# Patient Record
Sex: Female | Born: 1970 | Race: Black or African American | Hispanic: No | Smoking: Never smoker
Health system: Southern US, Community
[De-identification: ages and names within clinical notes are randomized; demographics above are authoritative.]

## PROBLEM LIST (undated history)

## (undated) ENCOUNTER — Emergency Department (HOSPITAL_COMMUNITY): Admission: EM | Payer: Medicare Other

## (undated) DIAGNOSIS — F329 Major depressive disorder, single episode, unspecified: Secondary | ICD-10-CM

## (undated) DIAGNOSIS — E7800 Pure hypercholesterolemia, unspecified: Secondary | ICD-10-CM

## (undated) DIAGNOSIS — F419 Anxiety disorder, unspecified: Secondary | ICD-10-CM

## (undated) DIAGNOSIS — R519 Headache, unspecified: Secondary | ICD-10-CM

## (undated) DIAGNOSIS — F32A Depression, unspecified: Secondary | ICD-10-CM

## (undated) DIAGNOSIS — G709 Myoneural disorder, unspecified: Secondary | ICD-10-CM

## (undated) DIAGNOSIS — R51 Headache: Secondary | ICD-10-CM

## (undated) DIAGNOSIS — R5383 Other fatigue: Secondary | ICD-10-CM

## (undated) DIAGNOSIS — G35 Multiple sclerosis: Secondary | ICD-10-CM

## (undated) DIAGNOSIS — IMO0001 Reserved for inherently not codable concepts without codable children: Secondary | ICD-10-CM

## (undated) DIAGNOSIS — G35D Multiple sclerosis, unspecified: Secondary | ICD-10-CM

## (undated) DIAGNOSIS — R079 Chest pain, unspecified: Secondary | ICD-10-CM

## (undated) HISTORY — PX: CHOLECYSTECTOMY: SHX55

## (undated) HISTORY — PX: ABDOMINAL HYSTERECTOMY: SHX81

## (undated) HISTORY — PX: WISDOM TOOTH EXTRACTION: SHX21

---

## 2003-06-03 ENCOUNTER — Emergency Department (HOSPITAL_COMMUNITY): Admission: EM | Admit: 2003-06-03 | Discharge: 2003-06-03 | Payer: Self-pay | Admitting: Emergency Medicine

## 2004-02-27 ENCOUNTER — Observation Stay (HOSPITAL_COMMUNITY): Admission: EM | Admit: 2004-02-27 | Discharge: 2004-02-28 | Payer: Self-pay | Admitting: Emergency Medicine

## 2004-02-28 ENCOUNTER — Encounter (INDEPENDENT_AMBULATORY_CARE_PROVIDER_SITE_OTHER): Payer: Self-pay | Admitting: *Deleted

## 2004-07-28 ENCOUNTER — Other Ambulatory Visit: Admission: RE | Admit: 2004-07-28 | Discharge: 2004-07-28 | Payer: Self-pay | Admitting: Family Medicine

## 2006-01-15 ENCOUNTER — Other Ambulatory Visit: Admission: RE | Admit: 2006-01-15 | Discharge: 2006-01-15 | Payer: Self-pay | Admitting: Family Medicine

## 2007-02-18 ENCOUNTER — Other Ambulatory Visit: Admission: RE | Admit: 2007-02-18 | Discharge: 2007-02-18 | Payer: Self-pay | Admitting: Family Medicine

## 2007-05-16 ENCOUNTER — Emergency Department (HOSPITAL_COMMUNITY): Admission: EM | Admit: 2007-05-16 | Discharge: 2007-05-17 | Payer: Self-pay | Admitting: Emergency Medicine

## 2007-09-30 ENCOUNTER — Encounter: Admission: RE | Admit: 2007-09-30 | Discharge: 2007-09-30 | Payer: Self-pay | Admitting: Family Medicine

## 2008-01-31 ENCOUNTER — Inpatient Hospital Stay (HOSPITAL_COMMUNITY): Admission: EM | Admit: 2008-01-31 | Discharge: 2008-02-01 | Payer: Self-pay | Admitting: Emergency Medicine

## 2008-02-03 ENCOUNTER — Encounter: Admission: RE | Admit: 2008-02-03 | Discharge: 2008-05-03 | Payer: Self-pay | Admitting: Internal Medicine

## 2008-05-04 ENCOUNTER — Other Ambulatory Visit: Admission: RE | Admit: 2008-05-04 | Discharge: 2008-05-04 | Payer: Self-pay | Admitting: Family Medicine

## 2009-03-23 ENCOUNTER — Emergency Department (HOSPITAL_COMMUNITY): Admission: EM | Admit: 2009-03-23 | Discharge: 2009-03-23 | Payer: Self-pay | Admitting: Emergency Medicine

## 2009-08-03 ENCOUNTER — Other Ambulatory Visit: Admission: RE | Admit: 2009-08-03 | Discharge: 2009-08-03 | Payer: Self-pay | Admitting: Family Medicine

## 2009-08-24 ENCOUNTER — Encounter: Admission: RE | Admit: 2009-08-24 | Discharge: 2009-08-24 | Payer: Self-pay | Admitting: Psychiatry

## 2010-06-25 ENCOUNTER — Encounter: Payer: Self-pay | Admitting: Neurology

## 2010-10-17 NOTE — Consult Note (Signed)
NAMEJACQUELEEN, [name] NO.:  [id number]   MEDICAL RECORD NO.:  [id number]          PATIENT TYPE:  INP   LOCATION:  3036                         FACILITY:  [facility]   PHYSICIAN:  Levert Feinstein, MD          DATE OF BIRTH:  Oct 14, 1970   DATE OF CONSULTATION:  DATE OF DISCHARGE:                                 CONSULTATION   CHIEF COMPLAINT:  This is a consult from [facility] team, Dr. Lendell Caprice, for  40 year-old female with acute vertigo and abnormal MRI.   HISTORY OF PRESENT ILLNESS:  The patient is a 40 year old right-handed  African American female with past medical history of hypertension for 7  years, well controlled by low-dose hydrochlorothiazide, presenting with  acute onset of vertigo starting Tuesday 4 days prior to admission.  She  woke up and her room was spinning, she had nausea, and gait instability,  lasted about couple hours, and gradually improved.  She stayed home  Tuesday, Wednesday, and Thursday, because her mother was visiting her.  She developed mild headache on Wednesday, but was able to drove herself  for pedicure, and headache improved after a couple hours, and overall  improvement in the following 2-3 days.   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  She become very frustrated, when she tried to get to the car  to get her high blood pressure medications, she noted unsteady gait.   She also complains mouth inside felt numb.  After work on Friday, she  presented to the Urgent Care, later guided into emergency room with  admission.   This is not her first vertigo episode, initial was in January 2009, she  presenting intense vertigo with associated nausea and unsteady gait that  lasted 1 day, left with some trail of discomfort, recurrent, short  lasting symptoms with sudden positional change.  In addition, she  reported a history of leg cramping in January  2009 involving the right  anterior leg.   There was no history of sudden vision loss, and tight band sensation  along the waist, urinary bowel incontinence, genital area numbness, gait  difficulty.   The MRI of the brain without contrast has demonstrated multiple  periventricular white matter disease, and marked confluent area  surrounding the atrium of the left lateral ventricle, there was also  area involving the left superior colliculus, and lesion in the left  corona radiata as well.  Some of the lesion such as the left mid brain,  and the left occipital horn of periventricular area lesion was DWI  positive.  No contrast was giving.   PAST MEDICAL HISTORY:  Hypertension.   PAST SURGICAL HISTORY:  Cholecystectomy.   FAMILY HISTORY:  Mother suffered breast cancer.  Father had hypertension  and hyperlipidemia.   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  Denies smoke and drink.  Is married.   CURRENT MEDICATIONS:  Aspirin,  potassium supplement, and Tylenol p.r.n.   ALLERGIES:  No known drug allergies.   PHYSICAL EXAMINATION:  VITAL SIGNS:  Heart rate was 70, temperature  97.6, and blood pressure 125/82.  CARDIAC:  Regular rate and rhythm.  PULMONARY:  Clear to auscultation bilaterally.  NECK:  Supple.  No carotid bruits.  NEUROLOGIC:  She was alert, oriented, tired looking, tearful during the  interview.  Cranial nerves II-XII.  Pupils equal, round, reactive to  light.  Fundi were sharp bilaterally.  I did not see afferent pupillary  defect, the extraocular movements were full but smooth pursuit was broke  into small catch-up saccade, worsening gaze to the left.  There was  horizontal nystagmus, not obvious when gaze to the right.  Facial  sensation had subjective decreased to light touch on the right face.  Corneal reflex normal and symmetric.  Gag reflex was brisk and uvula and  tongue midline.  Head turning and shoulder  shrugging were normal and  symmetric.  Tongue protrusion and cheek strength was normal.  Motor  examination, normal tone, bulk, and strength.  Sensory, decreased light  touch on the right hand and face.  Deep tendon reflex diffusely,  hyperreflexia 3/4.  Plantar responses were flexor bilaterally.  Coordination, mild dysmetria on left finger-to-nose, heel-to-shin.  Gait, she walked with a wide base, mildly unsteady gait, was not able to  perform tandem walking.   LAB EVALUATION:  ESR was 9.  Lipid profile demonstrated elevated  cholesterol of 207, LDL of 149.  Drug screen was negative.  Urine had a  mild urinary tract infection.  Pregnancy test was negative.  Alcohol  level was negative and CMP has demonstrated mild low potassium, was  otherwise normal.   ASSESSMENT:  A 40 year old female presenting with recurrent vertigo,  with mild right facial and mouth numbness and ataxic gait.  MRI  demonstrates multiple lesions involving left mid brain, and bilateral  periventricular white matter.   The clinical presentation in her age group, and MRI findings is most  supportive of relapsing remitting multiple sclerosis.   Plan is as following:  1. Complete imaging study, MRI of the brain with contrast, MRI of the      cervical and thoracic with and without contrast.  2. Lab evaluation to rule out mimics , such as vasculitis, Lyme      disease, syphilis, neurosarcoidosis, HIV.  3. CSF study to look in for oligoclonal bands, and elevated IgG index.  4. The patient will need physical therapy and occupational therapy for      her unsteady gait.  5. Hold off steroid treatment now.  She has mild neurological deficit,      hope to recover gradually, and we will finish the workup. Steroids      will not change to long-term outcome.  6. Will follow up with [facility] 1 week      postdischarge for long-term care plan.      Levert Feinstein, MD  Electronically Signed     YY/MEDQ  D:   01/31/2008  T:  02/01/2008  Job:  [id number]

## 2010-10-17 NOTE — H&P (Signed)
NAMEKENNEDEY, DIGILIO NO.:  [id number]   MEDICAL RECORD NO.:  [id number]          PATIENT TYPE:  INP   LOCATION:  3036                         FACILITY:  [facility]   PHYSICIAN:  Deirdre Peer. Polite, M.D. DATE OF BIRTH:  1970-06-24   DATE OF ADMISSION:  01/30/2008  DATE OF DISCHARGE:                              HISTORY & PHYSICAL   CHIEF COMPLAINT:  The patient presents to the ED with complaint of right  face and arm numbness since 6 a.m. this morning.  The patient awoken  with above symptoms.  Denied headaches.  However, did complain of  feeling off balance.  The patient presented to the walk-in clinic.  Subsequently was sent to the ED.  The patient was seen in the ED at  8:00, had screening labs.  BMET showed mild hypokalemia.  LFTs within  normal limits.  Alcohol level negative.  UA:  Leukocyte esterase small,  rare bacteria.  Drug screen negative.  CT of the head negative.  The  patient persistently had the above neural deficits.  Therefore, medicine  was called for evaluation.  The patient states she has had problems with  feeling off balance since January 2009.  She was treated with vertigo,  never had symptoms of right face and arm numbness.   PAST MEDICAL HISTORY:  Significant for hypertension.   CURRENT MEDICATIONS:  Hydrochlorothiazide 25 mg daily.   SOCIAL HISTORY:  Negative for tobacco.  Social alcohol.  No drugs.   ALLERGIES:  None.   FAMILY HISTORY:  Significant for hypertension, breast cancer and high  cholesterol in her mother.  Father hypertension, high cholesterol.   REVIEW OF SYSTEMS:  As stated in the HPI.   PHYSICAL EXAMINATION:  The patient is somewhat lethargic, otherwise  hemodynamically stable.  Temperature 98.9, BP 132/85, pulse 67,  respiratory rate 20, saturating 99%.  HEENT EXAM:  Unremarkable.  Negative for any carotid bruit.  CHEST:  Clear without rales or rhonchi.  CARDIOVASCULAR:  Regular S1-S2.  No S3.  ABDOMEN:  Soft and  tender.  EXTREMITIES:  No edema.  NEUROLOGIC EXAM:  Cranial nerves II-XII intact.  Motor 5/5.  Deep tendon  reflexes symmetrical.  Extraocular was intact.  Patient is unable to  perform tandem gait as she feels like she is falling to the right.  Toes  are downgoing bilaterally.  She has negative Romberg.  Sensation:  The  patient complains of feeling dullness on the right side of her face.   DATA:  As stated in HPI.   ASSESSMENT:  Neurological deficit characterized by prolonged right  facial numbness, right arm-hand numbness and feeling off balance.  Recommend the patient be admitted to a medicine floor bed.  The patient  will have a MRI-  MRA of brain.  Will obtain sed rate__________ TSH, ANA.  Will make  further recommendations after review of the above studies.  The patient  may warrant a neurology evaluation.   </PAST      Deirdre Peer. Polite, M.D.  Electronically Signed     RDP/MEDQ  D:  01/31/2008  T:  01/31/2008  Job:  [id number]

## 2010-10-17 NOTE — Discharge Summary (Signed)
[name], [name]                ACCOUNT NO.:  [id number]   MEDICAL RECORD NO.:  [id number]          PATIENT TYPE:  INP   LOCATION:  3036                         FACILITY:  [facility]   PHYSICIAN:  Corinna L. Lendell Caprice, MDDATE OF BIRTH:  10/12/1970   DATE OF ADMISSION:  01/30/2008  DATE OF DISCHARGE:  02/01/2008                               DISCHARGE SUMMARY   DISCHARGE DIAGNOSES:  1. Right face and arm numbness and abnormal MRI, most likely relapsing      and remitting multiple sclerosis, but studies pending.  2. History of genital herpes.  3. Hypertension.  4. Hypokalemia.  5. Hyperlipidemia.   DISCHARGE MEDICATIONS:  Continue hydrochlorothiazide 25 mg p.o. daily.   ACTIVITY:  She is not to return to work until cleared by Dr. Terrace Arabia.  No  driving until cleared by Dr. Terrace Arabia.  Outpatient physical therapy and  occupational therapy 3 times a week, [phone].   CONDITION:  Stable.   DIET:  Low salt.   CONSULTATIONS:  Levert Feinstein, MD   PROCEDURES:  None.   LABORATORY DATA:  CBC on admission was normal.  Complete metabolic panel  on admission significant for potassium of 3.2.  At discharge, after  repletion, potassium is 3.6.  Point-of-care enzymes negative.  LDL 149,  HDL 38, cholesterol 207, and triglycerides 99.  TSH 3.084.  Urine  pregnancy negative.  Urine drug screen negative.  Blood alcohol less  than 5.  Urinalysis showed small leukocyte esterase, 7-10 white cells,  negative blood, and rare bacteria.  Multiple tests are pending including  C-reactive protein, ANA, ACE level, Lyme titer, anticardiolipin  antibody, lupus anticoagulants, C-ANCA, P-ANCA, hepatitis panel, HIV,  B12 level, folate, and RPR.  These can be checked up at her follow up  visit with Neurology.   SPECIAL STUDIES/RADIOLOGY:  CT brain without contrast was negative.  MRI  of the brain without contrast showed multiple areas of T2 and flare  signal hyperintensity in the periventricular white matter bilaterally  with marked confluent areas concerning for demyelinating process such as  multiple sclerosis, viral encephalitis, or vasculitis, also within the  differential, atypical migraine or metastatic disease considered much  less likely.  MRI of the T-spine showed no demyelinating process or  infarct.  There was a marrow lesion involving the right aspect of the C7  vertebral body, likely hemangioma.  MRI of thoracic spine showed no  evidence of demyelinating process or infarct.  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  Multiple CT lesions do not  exhibit contrast enhancement.   HISTORY AND HOSPITAL COURSE:  [name] is a pleasant 40 year old black  female with a history of hypertension who presented to the emergency  room after noticing right face and hand numbness.   Please see H&P for admission details.  Upon further questioning, she  also reported having periodic vertigo and difficulty walking since  January.  She also reported photophobia and intermittent right hip pain  that has been  unexplained.  She also had problems concentrating and  particularly had difficulty talking.  She also noticed that she had  difficulty typing the day prior to admission.  She also reported some  tingling of her face and hand.  She had normal vital signs.  On physical  examination, she had decreased sensation on the right face and right  hand.  She was unable to perform tandem gait and had a wide-based gait.  Dr. Terrace Arabia was consulted and noted that the patient had horizontal  nystagmus and difficulty with mild dysmetria on the left.  MRI was done  and showed the above results.  This is felt to be relapsing remitting  multiple sclerosis, however, multiple studies have been ordered to rule  out other causes.  Her erythrocyte sedimentation rate is back and is   only 9.  The other tests were ordered today and are pending.  Physical  therapy saw the patient and recommended outpatient physical therapy,  also recommending outpatient occupational therapy.  A lumbar puncture  was ordered under fluoroscopic guidance, but it could not be done as it  was not an emergency and today is Sunday.  Dr. Terrace Arabia cancelled the lumbar  puncture and recommends doing it as an outpatient.  She also may decide  to do a bone scan as an outpatient, but I will defer to her.  The  patient will schedule up with Dr. Terrace Arabia and I have written a note for work  as well as for physical therapy and occupational therapy as an  outpatient.   Total time on the day of discharge is 1 hour.      Corinna L. Lendell Caprice, MD  Electronically Signed     CLS/MEDQ  D:  02/01/2008  T:  02/02/2008  Job:  [id number]   cc:   Levert Feinstein, MD  Otilio Connors. Gerri Spore, M.D.

## 2010-10-17 NOTE — H&P (Signed)
NAMEAEON, KOORS NO.:  [id number]   MEDICAL RECORD NO.:  [id number]          PATIENT TYPE:  INP   LOCATION:  3036                         FACILITY:  [facility]   PHYSICIAN:  Deirdre Peer. Polite, M.D. DATE OF BIRTH:  Apr 24, 1971   DATE OF ADMISSION:  01/30/2008  DATE OF DISCHARGE:                              HISTORY & PHYSICAL   CHIEF COMPLAINT:  Neuro deficit, right-sided facial numbness, right leg  numbness, and feeling off balance.   HISTORY OF PRESENT ILLNESS:  A 40 year old female with known history of  hypertension, who was initially seen in the Urgent Care for the above  symptoms.  The patient was sent to [facility].  The patient  states she awoke about 6 o'clock in the morning with right-sided  numbness and feeling off balance.  She had a history of having some  vertiginous symptoms dating back to January 2009; however, never right-  sided numbness.  Symptoms have been going on greater than 12 hours  before presenting to the ED for evaluation.  She is alert.  She is  oriented.  She is afebrile, hemodynamically stable.  Does carry a  history of hypertension being on hydrochlorothiazide, denies family  history of CVA.  In the ED, the patient was evaluated.  The patient had  a CT of the head without acute abnormalities.  BMET significant for a  slightly low potassium of 3.2.  CBC unremarkable.  Glucose within normal  limits.  UA significant for leukocyte esterase, small and rare bacteria.  Alcohol level less than 5.  LFTs unremarkable.  Drug screen  unremarkable.  Because of the patient's persistent neuro deficits,  Medicine was called for admission.  At the time of my evaluation, the  patient is lethargic, tired, and retells events as stated above.   PAST MEDICAL HISTORY:  Significant for hypertension.   CURRENT MEDICATIONS:  Include:  1. Hydrochlorothiazide 25 mg.  2. Multivitamin.   SOCIAL HISTORY:  Negative for tobacco.  Social alcohol.  No  drugs.   ALLERGIES:  None.   FAMILY HISTORY:  Mother with hypertension, high cholesterol, and breast  cancer.  Father with hypertension and high cholesterol.   REVIEW OF SYSTEMS:  Negative for any fever, chills, nausea, or vomiting.  No diarrhea, no constipation, no blood in the stool, no blood in the  urine, no chest pain, and no shortness of breath.   PHYSICAL EXAMINATION:  GENERAL:  Alert, however, allergic.  VITAL SIGNS:  Temperature 98.9, blood pressure 132/85, pulse 67,  respiratory rate 20, and sating 99%.  HEENT:  Unremarkable.  CHEST:  Clear without rales or rhonchi.  CARDIOVASCULAR:  Regular S1 and S2.  No S3.  ABDOMEN:  Soft and nontender.  EXTREMITIES:  No edema.  NEUROLOGIC:  Cranial nerves II through XII intact.  Motor 5/5.  Deep  tendon reflexes brisk.  Tandem gait, unable to perform.  The patient  constantly states that she feels she is falling to the right.  Downgoing  toes bilateral.  Negative Romberg.  SENSATION:  The patient complains of feeling decreased sensation in the  right face.   LABORATORY DATA:  As stated in the HPI.   ASSESSMENT:  Neuro deficit characterized by right face numbness, right  hand numbness, and feeling off balance.   RECOMMENDATION:  The patient be admitted.  We will obtain MRI and MRA of  the brain.  We will obtain screening labs.  We will make further  recommendations after review of those studies.  Please note, the patient  may warrant a neurology consultation.      Deirdre Peer. Polite, M.D.  Electronically Signed     RDP/MEDQ  D:  01/31/2008  T:  02/01/2008  Job:  [id number]

## 2010-10-20 NOTE — H&P (Signed)
[name], [name] NO.:  [id number]   MEDICAL RECORD NO.:  [id number]          PATIENT TYPE:  OBV   LOCATION:  0450                         FACILITY:  [facility]   PHYSICIAN:  Anselm Pancoast. Weatherly, M.D.DATE OF BIRTH:  1971/04/14   DATE OF ADMISSION:  02/27/2004  DATE OF DISCHARGE:                                HISTORY & PHYSICAL   CHIEF COMPLAINT:  Epigastric pain.   HISTORY:  [name] is a 40 year old black female who presented to the  emergency room approximately 4:30 with severe epigastric pain that had  lasted approximately an hour.  The patient had a similar episode of pain and  this is epigastric going to the small of the back approximately 10 days ago  and saw her regular physician Dr. Shaune Pollack, the following day liver  function studies were reported to be normal and then an ultrasound was  obtained at __________ that showed numerous stones within her gallbladder.  She had called and had an appointment with Dr. Derrell Lolling next Thursday but with  this severe episode of epigastric pain now she presented to the emergency  room.  She was seen by Dr. Wallace Cullens who did not have a copy or could not get the  results of the ultrasound and he repeated it plus did liver functions  studies.  The liver function studies now the SGOT and PT were abnormal and  repeat ultrasound was done and I was called approximately midnight.  I  talked with the patient and arranged to admit her, told her we would repeat  her liver function studies and then decide in the morning whether she needed  just to proceed with a laparoscopic cholecystectomy with cholangiogram or  whether possibly do an ERCP preoperatively if her liver tests were  definitely abnormal and worsening.   PAST MEDICAL HISTORY:  The patient has a history of mild hypertension and is  on hydrochlorothiazide.  She has got two children and has been on birth  control previously but not on birth control pills at this time.   CHRONIC MEDICATIONS:  Hydrochlorothiazide.  She is also on Zoloft  intermittently for chronic anxiety.   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   ALLERGIES:  No known allergies.   PHYSICAL EXAMINATION:  GENERAL:  She is a pleasant slightly overweight  female who appears anxious but not really complaining of any acute pain at  this time.  VITAL SIGNS:  Her temperature has always been normal 98.1.  Blood pressure  is 123/68, pulse 61, respirations 16.  When she first arrived, her pulse was  approximately 100.  HEAD AND NECK:  Eyes, ears, nose and throat well hydrated, no acute  problems, no cervical or supraclavicular lymphadenopathy.  LUNGS:  Clear.  CARDIAC:  Normal sinus rhythm.  BREASTS:  Negative.  ABDOMEN:  She is not acutely tender in the abdomen, maybe with deep  palpitation may be slightly tender.  RECTAL/PELVIC:  I did not do a rectal or pelvic exam on her.  EXTREMITIES:  Unremarkable.  CNS:  Physiologic.  LABORATORIES:  SGOT and SGPT are mildly elevated at approximately 600 and  400, respectively.  The total bilirubin and alkaline phosphatase are normal,  her white count is normal and her hematocrit is normal.   ASSESSMENT AND PLAN:  We will place her on antibiotics, n.p.o. and IV  fluids, repeat the liver function studies and then decide whether to proceed  with an urgent cholecystectomy in the early morning or whether she possibly  will need an ERCP.  Dr. Claudette Head is on call and we will contact him if  needed.      WJW/MEDQ  D:  02/27/2004  T:  02/27/2004  Job:  [id number]

## 2010-10-20 NOTE — Op Note (Signed)
NAMECHAMYA, HUNTON NO.:  [id number]   MEDICAL RECORD NO.:  [id number]          PATIENT TYPE:  OBV   LOCATION:  0450                         FACILITY:  [facility]   PHYSICIAN:  Anselm Pancoast. Weatherly, M.D.DATE OF BIRTH:  1971-01-18   DATE OF PROCEDURE:  02/27/2004  DATE OF DISCHARGE:                                 OPERATIVE REPORT   PREOPERATIVE DIAGNOSIS:  Chronic cholelithiasis with probable passage of  common duct stone.   POSTOPERATIVE DIAGNOSIS:  Chronic cholelithiasis with probable passage of  common duct stone.   OPERATION PERFORMED:  Laparoscopic cholecystectomy with cholangiogram.   SURGEON:  Anselm Pancoast. Zachery Dakins, M.D.   ANESTHESIA:  General.   INDICATIONS FOR PROCEDURE:  Asa Fath is a 40 year old female who  presented to the emergency room on Saturday afternoon after having the onset  of severe epigastric pain approximately 4 o'clock p.m.  The patient  approximately 10 days ago had an episode of similar pain radiating to the  epigastric area and small of her back and saw her regular physician, who  ordered an ultrasound of the gallbladder that was done at [facility].  This supposedly showed gallstones and she had called and  arranged for an appointment with Dr. Derrell Lolling which was scheduled for next  Thursday; however, with the onset of pain on Saturday, she presented to the  emergency room and was sent by Dr. Wallace Cullens.  He could not get the results of  the ultrasound and repeated it.  He also did liver function studies and this  time, the liver function studies that had been reported to be normal  previously were mildly elevated with an SGOT of 681, SGPT of 372.  The  ultrasound was repeated and I was called about midnight when it was  confirmed that she did have gallstones.  I talked with the patient and  recommended that we keep her and repeat the liver function studies.  If they  were abnormal and she was still having pain, we possibly  would need to do an  ERCP.  If her pain resolved and the tests were not obviously worsened, we  could proceed with a laparoscopic cholecystectomy and do an ERCP if the  stone was demonstrated in the common bile duct.  The following morning,  basically, the patient was pain free.  Her liver tests that were done about  three hours after the first set were more abnormal but it still just OT, PT,  alkaline phosphatase and bilirubin were sort of within normal limits.  Since  she was pain free and I talked with Judie Petit T. Russella Dar, M.D., we recommended  that we proceed on with a laparoscopic cholecystectomy.  Dr. Russella Dar would be  available if an ERCP was needed.  The patient had been started on Zosyn and  we gave her a dose immediately prior to surgery.   DESCRIPTION OF PROCEDURE:  Induction of general anesthesia.  She had PAS  stockings, positioned on the operating table, induction of general  anesthesia, endotracheal tube, oral tube to the stomach.  The abdomen was  prepped with  Betadine solution and draped in sterile manner.  A small  incision was made below the umbilicus.  She weighs about 180 pounds.  The  fascia was identified.  This was picked up between two Kochers and a small  opening made and entered into the peritoneal cavity.  A pursestring suture  of 0 Vicryl was placed and Hasson cannula introduced.  The gallbladder was  acutely inflamed and the upper 10 mm trocar was placed in the subxiphoid  after anesthetizing the fascia and Dr. Carolynne Edouard placed the two lateral 5 mm  trocars after anesthetizing the fascia.  The gallbladder was retracted  upward in abdomen, the proximal portion was placed on tension.  There were  some adhesions from the omentum to the gallbladder and these were kind of  carefully taken down.  The cystic duct was identified and this was  encompassed with a right angle and then a clip placed flush with the distal  portion of the cystic duct with the junction of the  gallbladder.  A small  opening was made and there was bile that was not under significant pressure  that came forth.  The [facility] catheter was introduced and held in place with a  clip and an X-ray was obtained.  It appears that the cystic duct actually  may come off the right hepatic or the three may come together in a pretty  similar area but there was good flow into the distal common bile duct and to  the duodenum, nothing dilated, no evidence of any common duct stones.  The  catheter was removed and then the cystic duct was triply clipped and divided  distally.  We were up close to the gallbladder and the cystic artery was  identified, triply clipped and divided proximal to the first two clips and  then the gallbladder was freed from its bed using predominantly the hook  electrocautery.  I placed the gallbladder in EndoCatch bag since it was so  acutely inflamed and numerous amount of stones and the looking down there  was a little aea that was bleeding and it was really coming from where the  cystic duct had been divided and another clip was placed just right at the  end with the bleeding under good control.  Next, the area was irrigated.  This was aspirated.  We were satisfied no bleeding.  Then the camera was  switched to the upper 10 mm port and gallbladder containing just numerous  stones, brought out through the fascia within the bag.  The separate figure-  of-eight suture of 0 Vicryl was placed as well as the pursestring that had  been previously placed tied and I anesthetized the fascia.  The carbon  dioxide was released.  Trocar was removed under direct vision and then the  subcutaneous wounds closed with 4-0 Vicryl.  Benzoin and Steri-Strips on the  skin.  The patient tolerated the procedure nicely and was taken to the  recovery room in stable postoperative condition.  I think the patient will  be ready for discharge in the early morning and will repeat a set of liver function  studies in approximately a week.      WJW/MEDQ  D:  02/27/2004  T:  02/28/2004  Job:  [id number]   cc:   Duncan Dull, M.D.  [address]  [place]  [place] [address]  Fax: [phone]

## 2011-02-20 ENCOUNTER — Other Ambulatory Visit: Payer: Self-pay | Admitting: Psychiatry

## 2011-02-20 DIAGNOSIS — G35 Multiple sclerosis: Secondary | ICD-10-CM

## 2011-02-23 ENCOUNTER — Ambulatory Visit
Admission: RE | Admit: 2011-02-23 | Discharge: 2011-02-23 | Disposition: A | Discharge status: Home / Self Care | Payer: 59 | Source: Ambulatory Visit | Attending: Psychiatry | Admitting: Psychiatry

## 2011-02-23 DIAGNOSIS — G35 Multiple sclerosis: Secondary | ICD-10-CM

## 2011-02-23 MED ORDER — GADOBENATE DIMEGLUMINE 529 MG/ML IV SOLN
19.0000 mL | Freq: Once | INTRAVENOUS | Status: AC | PRN
  Administered 2011-02-23: 19 mL via INTRAVENOUS

## 2011-07-06 ENCOUNTER — Other Ambulatory Visit (HOSPITAL_COMMUNITY)
Admission: RE | Admit: 2011-07-06 | Discharge: 2011-07-06 | Disposition: A | Discharge status: Home / Self Care | Payer: 59 | Source: Ambulatory Visit | Attending: Family Medicine | Admitting: Family Medicine

## 2011-07-06 ENCOUNTER — Other Ambulatory Visit: Payer: Self-pay | Admitting: Family Medicine

## 2011-07-06 DIAGNOSIS — Z124 Encounter for screening for malignant neoplasm of cervix: Secondary | ICD-10-CM | POA: Insufficient documentation

## 2011-12-20 DIAGNOSIS — G35 Multiple sclerosis: Secondary | ICD-10-CM | POA: Diagnosis not present

## 2012-01-01 ENCOUNTER — Other Ambulatory Visit: Payer: Self-pay | Admitting: Psychiatry

## 2012-01-01 DIAGNOSIS — G35 Multiple sclerosis: Secondary | ICD-10-CM

## 2012-01-09 ENCOUNTER — Inpatient Hospital Stay: Admission: RE | Admit: 2012-01-09 | Payer: 59 | Source: Ambulatory Visit

## 2012-01-15 ENCOUNTER — Ambulatory Visit
Admission: RE | Admit: 2012-01-15 | Discharge: 2012-01-15 | Disposition: A | Discharge status: Home / Self Care | Payer: 59 | Source: Ambulatory Visit | Attending: Psychiatry | Admitting: Psychiatry

## 2012-01-15 DIAGNOSIS — G35 Multiple sclerosis: Secondary | ICD-10-CM | POA: Diagnosis not present

## 2012-01-15 MED ORDER — GADOBENATE DIMEGLUMINE 529 MG/ML IV SOLN
19.0000 mL | Freq: Once | INTRAVENOUS | Status: AC | PRN
  Administered 2012-01-15: 19 mL via INTRAVENOUS

## 2012-04-16 ENCOUNTER — Other Ambulatory Visit (HOSPITAL_COMMUNITY): Payer: Self-pay | Admitting: Family Medicine

## 2012-04-16 DIAGNOSIS — Z1231 Encounter for screening mammogram for malignant neoplasm of breast: Secondary | ICD-10-CM

## 2012-04-18 DIAGNOSIS — G35 Multiple sclerosis: Secondary | ICD-10-CM | POA: Diagnosis not present

## 2012-04-18 DIAGNOSIS — G119 Hereditary ataxia, unspecified: Secondary | ICD-10-CM | POA: Diagnosis not present

## 2012-04-18 DIAGNOSIS — G959 Disease of spinal cord, unspecified: Secondary | ICD-10-CM | POA: Diagnosis not present

## 2012-05-07 DIAGNOSIS — E7800 Pure hypercholesterolemia, unspecified: Secondary | ICD-10-CM | POA: Diagnosis not present

## 2012-05-07 DIAGNOSIS — K5289 Other specified noninfective gastroenteritis and colitis: Secondary | ICD-10-CM | POA: Diagnosis not present

## 2012-05-07 DIAGNOSIS — I10 Essential (primary) hypertension: Secondary | ICD-10-CM | POA: Diagnosis not present

## 2012-05-08 ENCOUNTER — Ambulatory Visit (HOSPITAL_COMMUNITY): Payer: Medicare Other

## 2012-05-27 ENCOUNTER — Ambulatory Visit (HOSPITAL_COMMUNITY)
Admission: RE | Admit: 2012-05-27 | Discharge: 2012-05-27 | Disposition: A | Discharge status: Home / Self Care | Payer: 59 | Source: Ambulatory Visit | Attending: Family Medicine | Admitting: Family Medicine

## 2012-05-27 DIAGNOSIS — Z1231 Encounter for screening mammogram for malignant neoplasm of breast: Secondary | ICD-10-CM | POA: Diagnosis not present

## 2012-06-24 DIAGNOSIS — G35 Multiple sclerosis: Secondary | ICD-10-CM | POA: Diagnosis not present

## 2012-08-22 DIAGNOSIS — G35 Multiple sclerosis: Secondary | ICD-10-CM | POA: Diagnosis not present

## 2012-08-22 DIAGNOSIS — Z79899 Other long term (current) drug therapy: Secondary | ICD-10-CM | POA: Diagnosis not present

## 2012-09-26 DIAGNOSIS — G35 Multiple sclerosis: Secondary | ICD-10-CM | POA: Diagnosis not present

## 2012-10-31 DIAGNOSIS — G35 Multiple sclerosis: Secondary | ICD-10-CM | POA: Diagnosis not present

## 2012-11-28 DIAGNOSIS — G35 Multiple sclerosis: Secondary | ICD-10-CM | POA: Diagnosis not present

## 2012-12-26 DIAGNOSIS — G35 Multiple sclerosis: Secondary | ICD-10-CM | POA: Diagnosis not present

## 2012-12-26 DIAGNOSIS — I10 Essential (primary) hypertension: Secondary | ICD-10-CM | POA: Diagnosis not present

## 2013-01-02 DIAGNOSIS — G35 Multiple sclerosis: Secondary | ICD-10-CM | POA: Diagnosis not present

## 2013-01-30 DIAGNOSIS — G35 Multiple sclerosis: Secondary | ICD-10-CM | POA: Diagnosis not present

## 2013-02-20 DIAGNOSIS — Z23 Encounter for immunization: Secondary | ICD-10-CM | POA: Diagnosis not present

## 2013-02-20 DIAGNOSIS — I10 Essential (primary) hypertension: Secondary | ICD-10-CM | POA: Diagnosis not present

## 2013-02-20 DIAGNOSIS — G35 Multiple sclerosis: Secondary | ICD-10-CM | POA: Diagnosis not present

## 2013-03-04 DIAGNOSIS — G119 Hereditary ataxia, unspecified: Secondary | ICD-10-CM | POA: Diagnosis not present

## 2013-03-04 DIAGNOSIS — Z79899 Other long term (current) drug therapy: Secondary | ICD-10-CM | POA: Diagnosis not present

## 2013-03-04 DIAGNOSIS — G35 Multiple sclerosis: Secondary | ICD-10-CM | POA: Diagnosis not present

## 2013-04-03 DIAGNOSIS — G35 Multiple sclerosis: Secondary | ICD-10-CM | POA: Diagnosis not present

## 2013-04-03 DIAGNOSIS — Z79899 Other long term (current) drug therapy: Secondary | ICD-10-CM | POA: Diagnosis not present

## 2013-04-03 DIAGNOSIS — G119 Hereditary ataxia, unspecified: Secondary | ICD-10-CM | POA: Diagnosis not present

## 2013-04-08 DIAGNOSIS — D250 Submucous leiomyoma of uterus: Secondary | ICD-10-CM | POA: Diagnosis not present

## 2013-04-08 DIAGNOSIS — N920 Excessive and frequent menstruation with regular cycle: Secondary | ICD-10-CM | POA: Diagnosis not present

## 2013-04-14 DIAGNOSIS — B009 Herpesviral infection, unspecified: Secondary | ICD-10-CM | POA: Diagnosis not present

## 2013-04-14 DIAGNOSIS — D259 Leiomyoma of uterus, unspecified: Secondary | ICD-10-CM | POA: Diagnosis not present

## 2013-04-14 DIAGNOSIS — N926 Irregular menstruation, unspecified: Secondary | ICD-10-CM | POA: Diagnosis not present

## 2013-04-17 DIAGNOSIS — G35 Multiple sclerosis: Secondary | ICD-10-CM | POA: Diagnosis not present

## 2013-04-17 DIAGNOSIS — I10 Essential (primary) hypertension: Secondary | ICD-10-CM | POA: Diagnosis not present

## 2013-04-17 DIAGNOSIS — D7289 Other specified disorders of white blood cells: Secondary | ICD-10-CM | POA: Diagnosis not present

## 2013-05-08 DIAGNOSIS — G35 Multiple sclerosis: Secondary | ICD-10-CM | POA: Diagnosis not present

## 2013-06-12 DIAGNOSIS — G35 Multiple sclerosis: Secondary | ICD-10-CM | POA: Diagnosis not present

## 2013-07-10 DIAGNOSIS — G35 Multiple sclerosis: Secondary | ICD-10-CM | POA: Diagnosis not present

## 2013-07-10 DIAGNOSIS — Z79899 Other long term (current) drug therapy: Secondary | ICD-10-CM | POA: Diagnosis not present

## 2013-07-10 DIAGNOSIS — G959 Disease of spinal cord, unspecified: Secondary | ICD-10-CM | POA: Diagnosis not present

## 2013-08-07 DIAGNOSIS — G119 Hereditary ataxia, unspecified: Secondary | ICD-10-CM | POA: Diagnosis not present

## 2013-08-07 DIAGNOSIS — G35 Multiple sclerosis: Secondary | ICD-10-CM | POA: Diagnosis not present

## 2013-08-16 DIAGNOSIS — M79609 Pain in unspecified limb: Secondary | ICD-10-CM | POA: Diagnosis not present

## 2013-08-28 DIAGNOSIS — D7289 Other specified disorders of white blood cells: Secondary | ICD-10-CM | POA: Diagnosis not present

## 2013-08-28 DIAGNOSIS — G35 Multiple sclerosis: Secondary | ICD-10-CM | POA: Diagnosis not present

## 2013-08-28 DIAGNOSIS — G35D Multiple sclerosis, unspecified: Secondary | ICD-10-CM | POA: Diagnosis not present

## 2013-08-28 DIAGNOSIS — I10 Essential (primary) hypertension: Secondary | ICD-10-CM | POA: Diagnosis not present

## 2013-08-28 DIAGNOSIS — T451X5A Adverse effect of antineoplastic and immunosuppressive drugs, initial encounter: Secondary | ICD-10-CM | POA: Diagnosis not present

## 2013-08-28 DIAGNOSIS — D72819 Decreased white blood cell count, unspecified: Secondary | ICD-10-CM | POA: Diagnosis not present

## 2013-08-31 ENCOUNTER — Other Ambulatory Visit: Payer: Self-pay | Admitting: Psychiatry

## 2013-08-31 DIAGNOSIS — G35 Multiple sclerosis: Secondary | ICD-10-CM

## 2013-08-31 DIAGNOSIS — D7289 Other specified disorders of white blood cells: Secondary | ICD-10-CM | POA: Diagnosis not present

## 2013-09-03 DIAGNOSIS — Z79899 Other long term (current) drug therapy: Secondary | ICD-10-CM | POA: Diagnosis not present

## 2013-09-03 DIAGNOSIS — G35 Multiple sclerosis: Secondary | ICD-10-CM | POA: Diagnosis not present

## 2013-09-07 ENCOUNTER — Other Ambulatory Visit: Payer: 59

## 2013-09-09 DIAGNOSIS — R5383 Other fatigue: Secondary | ICD-10-CM | POA: Diagnosis not present

## 2013-09-09 DIAGNOSIS — R5381 Other malaise: Secondary | ICD-10-CM | POA: Diagnosis not present

## 2013-09-09 DIAGNOSIS — N9489 Other specified conditions associated with female genital organs and menstrual cycle: Secondary | ICD-10-CM | POA: Diagnosis not present

## 2013-09-09 DIAGNOSIS — N920 Excessive and frequent menstruation with regular cycle: Secondary | ICD-10-CM | POA: Diagnosis not present

## 2013-09-09 DIAGNOSIS — D259 Leiomyoma of uterus, unspecified: Secondary | ICD-10-CM | POA: Diagnosis not present

## 2013-09-10 ENCOUNTER — Other Ambulatory Visit (HOSPITAL_COMMUNITY): Payer: Self-pay | Admitting: Obstetrics & Gynecology

## 2013-09-10 DIAGNOSIS — Z1231 Encounter for screening mammogram for malignant neoplasm of breast: Secondary | ICD-10-CM

## 2013-09-11 ENCOUNTER — Ambulatory Visit
Admission: RE | Admit: 2013-09-11 | Discharge: 2013-09-11 | Disposition: A | Discharge status: Home / Self Care | Payer: Medicare Other | Source: Ambulatory Visit | Attending: Psychiatry | Admitting: Psychiatry

## 2013-09-11 DIAGNOSIS — G35 Multiple sclerosis: Secondary | ICD-10-CM

## 2013-09-11 MED ORDER — GADOBENATE DIMEGLUMINE 529 MG/ML IV SOLN
20.0000 mL | Freq: Once | INTRAVENOUS | Status: AC | PRN
  Administered 2013-09-11: 20 mL via INTRAVENOUS

## 2013-09-17 ENCOUNTER — Ambulatory Visit (HOSPITAL_COMMUNITY)
Admission: RE | Admit: 2013-09-17 | Discharge: 2013-09-17 | Disposition: A | Discharge status: Home / Self Care | Payer: Medicare Other | Source: Ambulatory Visit | Attending: Obstetrics & Gynecology | Admitting: Obstetrics & Gynecology

## 2013-09-17 DIAGNOSIS — Z1231 Encounter for screening mammogram for malignant neoplasm of breast: Secondary | ICD-10-CM | POA: Insufficient documentation

## 2013-10-01 DIAGNOSIS — G35 Multiple sclerosis: Secondary | ICD-10-CM | POA: Diagnosis not present

## 2013-10-29 DIAGNOSIS — G35 Multiple sclerosis: Secondary | ICD-10-CM | POA: Diagnosis not present

## 2013-10-29 DIAGNOSIS — Z79899 Other long term (current) drug therapy: Secondary | ICD-10-CM | POA: Diagnosis not present

## 2013-11-27 DIAGNOSIS — G35 Multiple sclerosis: Secondary | ICD-10-CM | POA: Diagnosis not present

## 2013-12-11 DIAGNOSIS — G35 Multiple sclerosis: Secondary | ICD-10-CM | POA: Diagnosis not present

## 2013-12-11 DIAGNOSIS — D72819 Decreased white blood cell count, unspecified: Secondary | ICD-10-CM | POA: Diagnosis not present

## 2013-12-24 DIAGNOSIS — Z79899 Other long term (current) drug therapy: Secondary | ICD-10-CM | POA: Diagnosis not present

## 2013-12-24 DIAGNOSIS — G35 Multiple sclerosis: Secondary | ICD-10-CM | POA: Diagnosis not present

## 2014-01-21 DIAGNOSIS — G35 Multiple sclerosis: Secondary | ICD-10-CM | POA: Diagnosis not present

## 2014-02-18 DIAGNOSIS — Z79899 Other long term (current) drug therapy: Secondary | ICD-10-CM | POA: Diagnosis not present

## 2014-02-18 DIAGNOSIS — G35 Multiple sclerosis: Secondary | ICD-10-CM | POA: Diagnosis not present

## 2014-02-18 DIAGNOSIS — Z298 Encounter for other specified prophylactic measures: Secondary | ICD-10-CM | POA: Diagnosis not present

## 2014-03-18 DIAGNOSIS — Z418 Encounter for other procedures for purposes other than remedying health state: Secondary | ICD-10-CM | POA: Diagnosis not present

## 2014-03-18 DIAGNOSIS — G35 Multiple sclerosis: Secondary | ICD-10-CM | POA: Diagnosis not present

## 2014-03-26 DIAGNOSIS — D72819 Decreased white blood cell count, unspecified: Secondary | ICD-10-CM | POA: Diagnosis not present

## 2014-03-26 DIAGNOSIS — G35 Multiple sclerosis: Secondary | ICD-10-CM | POA: Diagnosis not present

## 2014-03-26 DIAGNOSIS — Z23 Encounter for immunization: Secondary | ICD-10-CM | POA: Diagnosis not present

## 2014-03-26 DIAGNOSIS — I10 Essential (primary) hypertension: Secondary | ICD-10-CM | POA: Diagnosis not present

## 2014-04-12 DIAGNOSIS — E780 Pure hypercholesterolemia: Secondary | ICD-10-CM | POA: Diagnosis not present

## 2014-04-12 DIAGNOSIS — F329 Major depressive disorder, single episode, unspecified: Secondary | ICD-10-CM | POA: Diagnosis not present

## 2014-04-12 DIAGNOSIS — Z0000 Encounter for general adult medical examination without abnormal findings: Secondary | ICD-10-CM | POA: Diagnosis not present

## 2014-04-12 DIAGNOSIS — G5600 Carpal tunnel syndrome, unspecified upper limb: Secondary | ICD-10-CM | POA: Diagnosis not present

## 2014-04-12 DIAGNOSIS — G35 Multiple sclerosis: Secondary | ICD-10-CM | POA: Diagnosis not present

## 2014-04-12 DIAGNOSIS — I10 Essential (primary) hypertension: Secondary | ICD-10-CM | POA: Diagnosis not present

## 2014-04-15 DIAGNOSIS — G35 Multiple sclerosis: Secondary | ICD-10-CM | POA: Diagnosis not present

## 2014-04-15 DIAGNOSIS — Z418 Encounter for other procedures for purposes other than remedying health state: Secondary | ICD-10-CM | POA: Diagnosis not present

## 2014-06-11 DIAGNOSIS — G35 Multiple sclerosis: Secondary | ICD-10-CM | POA: Diagnosis not present

## 2014-06-11 DIAGNOSIS — Z79899 Other long term (current) drug therapy: Secondary | ICD-10-CM | POA: Diagnosis not present

## 2014-06-28 ENCOUNTER — Other Ambulatory Visit: Payer: Self-pay | Admitting: Obstetrics & Gynecology

## 2014-06-28 DIAGNOSIS — N920 Excessive and frequent menstruation with regular cycle: Secondary | ICD-10-CM | POA: Diagnosis not present

## 2014-06-28 DIAGNOSIS — N939 Abnormal uterine and vaginal bleeding, unspecified: Secondary | ICD-10-CM | POA: Diagnosis not present

## 2014-06-28 DIAGNOSIS — D259 Leiomyoma of uterus, unspecified: Secondary | ICD-10-CM | POA: Diagnosis not present

## 2014-07-01 DIAGNOSIS — N920 Excessive and frequent menstruation with regular cycle: Secondary | ICD-10-CM | POA: Diagnosis not present

## 2014-07-01 DIAGNOSIS — D259 Leiomyoma of uterus, unspecified: Secondary | ICD-10-CM | POA: Diagnosis not present

## 2014-07-02 DIAGNOSIS — G35 Multiple sclerosis: Secondary | ICD-10-CM | POA: Diagnosis not present

## 2014-07-02 DIAGNOSIS — I10 Essential (primary) hypertension: Secondary | ICD-10-CM | POA: Diagnosis not present

## 2014-07-02 DIAGNOSIS — D72819 Decreased white blood cell count, unspecified: Secondary | ICD-10-CM | POA: Diagnosis not present

## 2014-07-08 DIAGNOSIS — G35 Multiple sclerosis: Secondary | ICD-10-CM | POA: Diagnosis not present

## 2014-10-22 DIAGNOSIS — D72819 Decreased white blood cell count, unspecified: Secondary | ICD-10-CM | POA: Diagnosis not present

## 2014-10-22 DIAGNOSIS — G35 Multiple sclerosis: Secondary | ICD-10-CM | POA: Diagnosis not present

## 2014-10-22 DIAGNOSIS — I10 Essential (primary) hypertension: Secondary | ICD-10-CM | POA: Diagnosis not present

## 2014-10-23 DIAGNOSIS — D72819 Decreased white blood cell count, unspecified: Secondary | ICD-10-CM | POA: Diagnosis not present

## 2014-10-23 DIAGNOSIS — G35 Multiple sclerosis: Secondary | ICD-10-CM | POA: Diagnosis not present

## 2014-10-23 DIAGNOSIS — I10 Essential (primary) hypertension: Secondary | ICD-10-CM | POA: Diagnosis not present

## 2015-01-18 ENCOUNTER — Encounter (HOSPITAL_COMMUNITY)
Admission: RE | Admit: 2015-01-18 | Discharge: 2015-01-18 | Disposition: A | Discharge status: Home / Self Care | Payer: BLUE CROSS/BLUE SHIELD | Source: Ambulatory Visit | Attending: Obstetrics & Gynecology | Admitting: Obstetrics & Gynecology

## 2015-01-18 ENCOUNTER — Encounter (HOSPITAL_COMMUNITY): Payer: Self-pay

## 2015-01-18 ENCOUNTER — Other Ambulatory Visit: Payer: Self-pay

## 2015-01-18 DIAGNOSIS — G35 Multiple sclerosis: Secondary | ICD-10-CM | POA: Diagnosis not present

## 2015-01-18 DIAGNOSIS — Z79899 Other long term (current) drug therapy: Secondary | ICD-10-CM | POA: Diagnosis not present

## 2015-01-18 DIAGNOSIS — Z888 Allergy status to other drugs, medicaments and biological substances status: Secondary | ICD-10-CM | POA: Diagnosis not present

## 2015-01-18 DIAGNOSIS — Z803 Family history of malignant neoplasm of breast: Secondary | ICD-10-CM | POA: Diagnosis not present

## 2015-01-18 DIAGNOSIS — Z01818 Encounter for other preprocedural examination: Secondary | ICD-10-CM | POA: Diagnosis not present

## 2015-01-18 DIAGNOSIS — E780 Pure hypercholesterolemia: Secondary | ICD-10-CM | POA: Diagnosis not present

## 2015-01-18 DIAGNOSIS — Z23 Encounter for immunization: Secondary | ICD-10-CM | POA: Diagnosis not present

## 2015-01-18 DIAGNOSIS — D259 Leiomyoma of uterus, unspecified: Secondary | ICD-10-CM | POA: Diagnosis not present

## 2015-01-18 DIAGNOSIS — I10 Essential (primary) hypertension: Secondary | ICD-10-CM | POA: Diagnosis not present

## 2015-01-18 DIAGNOSIS — Z9049 Acquired absence of other specified parts of digestive tract: Secondary | ICD-10-CM | POA: Diagnosis not present

## 2015-01-18 DIAGNOSIS — N8189 Other female genital prolapse: Secondary | ICD-10-CM | POA: Diagnosis present

## 2015-01-18 DIAGNOSIS — F329 Major depressive disorder, single episode, unspecified: Secondary | ICD-10-CM | POA: Diagnosis not present

## 2015-01-18 DIAGNOSIS — F419 Anxiety disorder, unspecified: Secondary | ICD-10-CM | POA: Diagnosis not present

## 2015-01-18 HISTORY — DX: Reserved for inherently not codable concepts without codable children: IMO0001

## 2015-01-18 HISTORY — DX: Anxiety disorder, unspecified: F41.9

## 2015-01-18 HISTORY — DX: Myoneural disorder, unspecified: G70.9

## 2015-01-18 HISTORY — DX: Major depressive disorder, single episode, unspecified: F32.9

## 2015-01-18 HISTORY — DX: Depression, unspecified: F32.A

## 2015-01-18 HISTORY — DX: Headache, unspecified: R51.9

## 2015-01-18 HISTORY — DX: Other fatigue: R53.83

## 2015-01-18 HISTORY — DX: Headache: R51

## 2015-01-18 HISTORY — DX: Chest pain, unspecified: R07.9

## 2015-01-18 LAB — BASIC METABOLIC PANEL
ANION GAP: 9 (ref 5–15)
BUN: 13 mg/dL (ref 6–20)
CHLORIDE: 103 mmol/L (ref 101–111)
CO2: 25 mmol/L (ref 22–32)
Calcium: 9.1 mg/dL (ref 8.9–10.3)
Creatinine, Ser: 0.94 mg/dL (ref 0.44–1.00)
GFR calc Af Amer: >60 mL/min (ref >60)
GFR calc non Af Amer: >60 mL/min (ref >60)
Glucose, Bld: 84 mg/dL (ref 65–99)
POTASSIUM: 3.5 mmol/L (ref 3.5–5.1)
SODIUM: 137 mmol/L (ref 135–145)

## 2015-01-18 LAB — CBC
HEMATOCRIT: 30.6 % — AB (ref 36.0–46.0)
Hemoglobin: 9.4 g/dL — ABNORMAL LOW (ref 12.0–15.0)
MCH: 22.4 pg — ABNORMAL LOW (ref 26.0–34.0)
MCHC: 30.7 g/dL (ref 30.0–36.0)
MCV: 72.9 fL — ABNORMAL LOW (ref 78.0–100.0)
Platelets: 333 10*3/uL (ref 150–400)
RBC: 4.2 MIL/uL (ref 3.87–5.11)
RDW: 15 % (ref 11.5–15.5)
WBC: 8.3 10*3/uL (ref 4.0–10.5)

## 2015-01-18 LAB — TYPE AND SCREEN
ABO/RH(D): O POS
ANTIBODY SCREEN: NEGATIVE

## 2015-01-18 LAB — ABO/RH: ABO/RH(D): O POS

## 2015-01-18 NOTE — Patient Instructions (Signed)
Your procedure is scheduled on:02/09/15  Enter through the Main Entrance at :10:45 am Pick up desk phone and dial [phone] and inform us of your arrival.  Please call [phone] if you have any problems the morning of surgery.  Remember: Do not eat food after midnight: Tuesday Clear liquids are ok until:8am on WED   You may brush your teeth the morning of surgery.  Take these meds the morning of surgery with a sip of water: all prescribed am meds  DO NOT wear jewelry, eye make-up, lipstick,body lotion, or dark fingernail polish.  (Polished toes are ok) You may wear deodorant.  If you are to be admitted after surgery, leave suitcase in car until your room has been assigned. Patients discharged on the day of surgery will not be allowed to drive home. Wear loose fitting, comfortable clothes for your ride home.

## 2015-02-08 NOTE — H&P (Signed)
31y G2P2 who presents LAVH, BS due to abnomral uterine bleeding/heavy menstrual bleeding. She has been using Sprintec, which has made things somewhat better, but not manageable. Menses are still a full 7 days and this last period prior to her menses she had a full week of intense cramping, not improved with OTC medication. Pt reports no changes since her prior visit. At this point, after reviewed her potential options, she desires to proceed with surgical management. As part of her work up an TVUS and EMB have been performed (results below). Labs performed at hospital Hgb 9.4- started on iron twice daily. 07/01/14: TVUS reviewed: Retroverted uterus measuring 9.3x7.2x6.5cm- posterior fundal submucosal fibroid 4.7x3.7x3.6cm, anterior pedunculated 1.9x1.9x1.5cm- increased in size from previous US. Endometrium difficult to visualize fundally due to submucosal fibroid distorting and pushing on endometrial cavity. Bilateral ovaries unremarkable. 06/28/14: EMB negative for hyperplasia or malignancy.      ROS:  CONSTITUTIONAL:  no Chills. no Fever. no Night sweats.  SKIN:  no Rash. no Hives.  HEENT:  Blurrred vision no. no Double vision.  CARDIOLOGY:  no Chest pain.  RESPIRATORY:  no Shortness of breath. no Cough.  GASTROENTEROLOGY:  no Abdominal pain. no Appetite change. no Change in bowel movements.  UROLOGY:  no Urinary frequency. no Urinary incontinence. no Urinary urgency.  FEMALE REPRODUCTIVE:  no Breast lumps or discharge. no Breast pain.  NEUROLOGY:  no Dizziness. no Headache. no Loss of consciousness.  PSYCHOLOGY:  no Depression. no Confusion.  HEMATOLOGY/LYMPH:  no Anemia. no Fatigue. Using Blood Thinners no.         Medical History: Non-smoking, Hypertension, F. hx. breast cancer, Hypercholesterolemia, Depression, Herpes simplex - right thigh, possible M.S..        Gyn History:  Sexual activity currently sexually active.  Periods : every 28 days, heavy blood loss.  LMP  01/23/2015.  Birth control Sprintec.  Last pap smear date 07/2011 Negative.  Last mammogram date 09/17/2013.        OB History:  Number of pregnancies Gravida 2; Para 2.  Pregnancy # 1 live birth, vaginal delivery.  Pregnancy # 2 live birth, vaginal delivery.        Surgical History: cholecystectomy 02/27/2004, hemorrhoids 1990.        Hospitalization/Major Diagnostic Procedure: see surg. hx , childbirth , dx MS 2009.        Family History: Father: alive 30 yrs, hypertension, cholesterol, diagnosed with HTNMother: alive 77 yrs, hypertension, breast cancer, hypertension, cholesterol, diagnosed with HTN, Breast CaBrother 1: alive 35 yrs, A + WMaternal aunt: alive, uterine cancer neg. f. hx. colon cancer.       Social History:  General:  Tobacco use  cigarettes: Never smoked Tobacco history last updated 08/30/2014 Exercise: walks dog.  Marital Status: married.  Occupation: unemployed, housewife.  no Exposed to passive smoke.  Alcohol: occasionally, wine.  Caffeine: 1 serving daily, coffee, tea once a week, soda, 1 serving daily.  Recreational drug use: never.  Children: 2.  Seat belt use: yes.        Medications:  Tysabri(Natalizumab) 300 MG/15ML Concentrate infusion once a month,  Fluoxetine once a day,  Triamterene-HCTZ 37.5-25 MG Tablet 1 tablet in the morning every morning, Notes: was off x 2 months, just started back 2 week,  Valtrex(ValACYclovir HCl) 1 GM Tablet 1 tablet every 24 hrs,  Sprintec 28(Norgestimate-Eth Estradiol) 0.25-35 MG-MCG Tablet 1 tablet Once a day,  Ritalin(Methylphenidate HCl) 10 MG Tablet 1 tablet Once a day,  Vitamin D ,  Iron  28m Tablet 1 tablet Once a day       Allergies: Pravastatin: hives: Allergy.       Objective: Examination performed in office (01/24/15)     Vitals: Wt 196.5, Wt change -2.5 lb, Ht 65.25, BMI 32.45, Temp 98.4, Pulse sitting 84, BP sitting 125/79.       Examination:  General Examination:  GENERAL APPEARANCE well  developed, well nourished.  SKIN: warm and dry, no rashes.  NECK: supple, normal appearance.  LUNGS: clear to auscultation bilaterally, no wheezes, rhonchi, rales.  HEART: no murmurs, regular rate and rhythm.  ABDOMEN: no masses palpated, soft and not tender, no rebound, no guarding.  MUSCULOSKELETAL no calf tenderness bilaterally.  EXTREMITIES: no edema present.         LABS:  Results for KSERENITIE, VINTON(MRN [id number] as of 02/08/2015 21:30  Ref. Range 01/18/2015 10:35  Sodium Latest Ref Range: 135-145 mmol/L 137  Potassium Latest Ref Range: 3.5-5.1 mmol/L 3.5  Chloride Latest Ref Range: 101-111 mmol/L 103  CO2 Latest Ref Range: 22-32 mmol/L 25  BUN Latest Ref Range: 6-20 mg/dL 13  Creatinine Latest Ref Range: 0.44-1.00 mg/dL 0.94  Calcium Latest Ref Range: 8.9-10.3 mg/dL 9.1  EGFR (Non-African Amer.) Latest Ref Range: >60 mL/min >60  EGFR (African American) Latest Ref Range: >60 mL/min >60  Glucose Latest Ref Range: 65-99 mg/dL 84  Anion gap Latest Ref Range: 5-15  9  WBC Latest Ref Range: 4.0-10.5 K/uL 8.3  RBC Latest Ref Range: 3.87-5.11 MIL/uL 4.20  Hemoglobin Latest Ref Range: 12.0-15.0 g/dL 9.4 (L)  HCT Latest Ref Range: 36.0-46.0 % 30.6 (L)  MCV Latest Ref Range: 78.0-100.0 fL 72.9 (L)  MCH Latest Ref Range: 26.0-34.0 pg 22.4 (L)  MCHC Latest Ref Range: 30.0-36.0 g/dL 30.7  RDW Latest Ref Range: 11.5-15.5 % 15.0  Platelets Latest Ref Range: 150-400 K/uL 333  ABO/RH(D) Unknown O POS    A/P: 89yoG2P2 who presents for LAVH, BS due to abnormal uterine bleeding -NPO -LR @ 125cc/hr -Ancef 2g IV to OR -SCDs to OR -Risk, benefit and potential complications reviewed with patient including risk of bleeding, infection and injury.  Pt aware and wishes to proceed.  JJanyth Pupa DO [phone](pager) [phone](office)

## 2015-02-09 ENCOUNTER — Encounter (HOSPITAL_COMMUNITY): Payer: Self-pay | Admitting: *Deleted

## 2015-02-09 ENCOUNTER — Ambulatory Visit (HOSPITAL_COMMUNITY): Payer: Medicare Other | Admitting: Anesthesiology

## 2015-02-09 ENCOUNTER — Observation Stay (HOSPITAL_COMMUNITY)
Admission: RE | Admit: 2015-02-09 | Discharge: 2015-02-10 | Disposition: A | Discharge status: Home / Self Care | Payer: Medicare Other | Source: Ambulatory Visit | Attending: Obstetrics & Gynecology | Admitting: Obstetrics & Gynecology

## 2015-02-09 ENCOUNTER — Encounter (HOSPITAL_COMMUNITY)
Admission: RE | Disposition: A | Discharge status: Home / Self Care | Payer: Self-pay | Source: Ambulatory Visit | Attending: Obstetrics & Gynecology

## 2015-02-09 DIAGNOSIS — F419 Anxiety disorder, unspecified: Secondary | ICD-10-CM | POA: Diagnosis not present

## 2015-02-09 DIAGNOSIS — Z79899 Other long term (current) drug therapy: Secondary | ICD-10-CM | POA: Insufficient documentation

## 2015-02-09 DIAGNOSIS — I10 Essential (primary) hypertension: Secondary | ICD-10-CM | POA: Diagnosis not present

## 2015-02-09 DIAGNOSIS — N8189 Other female genital prolapse: Secondary | ICD-10-CM | POA: Diagnosis present

## 2015-02-09 DIAGNOSIS — N939 Abnormal uterine and vaginal bleeding, unspecified: Secondary | ICD-10-CM | POA: Diagnosis present

## 2015-02-09 DIAGNOSIS — Z888 Allergy status to other drugs, medicaments and biological substances status: Secondary | ICD-10-CM | POA: Diagnosis not present

## 2015-02-09 DIAGNOSIS — Z23 Encounter for immunization: Secondary | ICD-10-CM | POA: Insufficient documentation

## 2015-02-09 DIAGNOSIS — G35 Multiple sclerosis: Secondary | ICD-10-CM | POA: Insufficient documentation

## 2015-02-09 DIAGNOSIS — F329 Major depressive disorder, single episode, unspecified: Secondary | ICD-10-CM | POA: Insufficient documentation

## 2015-02-09 DIAGNOSIS — E780 Pure hypercholesterolemia: Secondary | ICD-10-CM | POA: Insufficient documentation

## 2015-02-09 DIAGNOSIS — D259 Leiomyoma of uterus, unspecified: Principal | ICD-10-CM | POA: Insufficient documentation

## 2015-02-09 DIAGNOSIS — N8320 Unspecified ovarian cysts: Secondary | ICD-10-CM | POA: Diagnosis not present

## 2015-02-09 DIAGNOSIS — N814 Uterovaginal prolapse, unspecified: Secondary | ICD-10-CM | POA: Diagnosis not present

## 2015-02-09 DIAGNOSIS — Z9049 Acquired absence of other specified parts of digestive tract: Secondary | ICD-10-CM | POA: Diagnosis not present

## 2015-02-09 DIAGNOSIS — Z803 Family history of malignant neoplasm of breast: Secondary | ICD-10-CM | POA: Diagnosis not present

## 2015-02-09 DIAGNOSIS — D251 Intramural leiomyoma of uterus: Secondary | ICD-10-CM | POA: Diagnosis not present

## 2015-02-09 DIAGNOSIS — N920 Excessive and frequent menstruation with regular cycle: Secondary | ICD-10-CM | POA: Diagnosis not present

## 2015-02-09 HISTORY — PX: BILATERAL SALPINGECTOMY: SHX5743

## 2015-02-09 LAB — CBC
HCT: 33.6 % — ABNORMAL LOW (ref 36.0–46.0)
HEMOGLOBIN: 10.6 g/dL — AB (ref 12.0–15.0)
MCH: 23.9 pg — AB (ref 26.0–34.0)
MCHC: 31.5 g/dL (ref 30.0–36.0)
MCV: 75.7 fL — ABNORMAL LOW (ref 78.0–100.0)
Platelets: 297 10*3/uL (ref 150–400)
RBC: 4.44 MIL/uL (ref 3.87–5.11)
RDW: 22.6 % — ABNORMAL HIGH (ref 11.5–15.5)
WBC: 7.5 10*3/uL (ref 4.0–10.5)

## 2015-02-09 LAB — TYPE AND SCREEN
ABO/RH(D): O POS
ANTIBODY SCREEN: NEGATIVE

## 2015-02-09 LAB — HCG, SERUM, QUALITATIVE: PREG SERUM: NEGATIVE

## 2015-02-09 SURGERY — HYSTERECTOMY, VAGINAL, LAPAROSCOPY-ASSISTED, WITH SALPINGECTOMY
Anesthesia: General | Site: Abdomen | Laterality: Bilateral

## 2015-02-09 MED ORDER — MIDAZOLAM HCL 5 MG/5ML IJ SOLN
INTRAMUSCULAR | Status: DC | PRN
  Administered 2015-02-09: 2 mg via INTRAVENOUS

## 2015-02-09 MED ORDER — HYDROMORPHONE HCL 1 MG/ML IJ SOLN
INTRAMUSCULAR | Status: AC
  Filled 2015-02-09: qty 1

## 2015-02-09 MED ORDER — HYDROMORPHONE HCL 1 MG/ML IJ SOLN
INTRAMUSCULAR | Status: DC | PRN
  Administered 2015-02-09: 1 mg via INTRAVENOUS

## 2015-02-09 MED ORDER — PROPOFOL 10 MG/ML IV BOLUS
INTRAVENOUS | Status: AC
  Filled 2015-02-09: qty 20

## 2015-02-09 MED ORDER — KETOROLAC TROMETHAMINE 30 MG/ML IJ SOLN
30.0000 mg | Freq: Four times a day (QID) | INTRAMUSCULAR | Status: DC
  Administered 2015-02-09 – 2015-02-10 (×3): 30 mg via INTRAVENOUS
  Filled 2015-02-09 (×3): qty 1

## 2015-02-09 MED ORDER — ROCURONIUM BROMIDE 100 MG/10ML IV SOLN
INTRAVENOUS | Status: AC
  Filled 2015-02-09: qty 1

## 2015-02-09 MED ORDER — KETOROLAC TROMETHAMINE 30 MG/ML IJ SOLN: 30.0000 mg | Freq: Four times a day (QID) | INTRAMUSCULAR | Status: DC

## 2015-02-09 MED ORDER — DIPHENHYDRAMINE HCL 50 MG/ML IJ SOLN
25.0000 mg | Freq: Four times a day (QID) | INTRAMUSCULAR | Status: DC | PRN
  Filled 2015-02-09: qty 1

## 2015-02-09 MED ORDER — ONDANSETRON HCL 4 MG/2ML IJ SOLN
INTRAMUSCULAR | Status: DC | PRN
  Administered 2015-02-09: 4 mg via INTRAVENOUS

## 2015-02-09 MED ORDER — SCOPOLAMINE 1 MG/3DAYS TD PT72: 1.0000 | MEDICATED_PATCH | Freq: Once | TRANSDERMAL | Status: DC

## 2015-02-09 MED ORDER — SODIUM CHLORIDE 0.9 % IJ SOLN
INTRAMUSCULAR | Status: AC
  Filled 2015-02-09: qty 100

## 2015-02-09 MED ORDER — OXYCODONE-ACETAMINOPHEN 5-325 MG PO TABS: 1.0000 | ORAL_TABLET | ORAL | Status: DC | PRN

## 2015-02-09 MED ORDER — HYDROMORPHONE HCL 1 MG/ML IJ SOLN
1.0000 mg | INTRAMUSCULAR | Status: DC | PRN
  Administered 2015-02-09 – 2015-02-10 (×2): 1 mg via INTRAVENOUS
  Filled 2015-02-09 (×2): qty 1

## 2015-02-09 MED ORDER — DEXAMETHASONE SODIUM PHOSPHATE 10 MG/ML IJ SOLN
INTRAMUSCULAR | Status: AC
  Filled 2015-02-09: qty 1

## 2015-02-09 MED ORDER — FENTANYL CITRATE (PF) 250 MCG/5ML IJ SOLN
INTRAMUSCULAR | Status: AC
  Filled 2015-02-09: qty 25

## 2015-02-09 MED ORDER — ONDANSETRON HCL 4 MG/2ML IJ SOLN: 4.0000 mg | Freq: Four times a day (QID) | INTRAMUSCULAR | Status: DC | PRN

## 2015-02-09 MED ORDER — MENTHOL 3 MG MT LOZG: 1.0000 | LOZENGE | OROMUCOSAL | Status: DC | PRN

## 2015-02-09 MED ORDER — VASOPRESSIN 20 UNIT/ML IV SOLN
INTRAVENOUS | Status: AC
  Filled 2015-02-09: qty 1

## 2015-02-09 MED ORDER — KETOROLAC TROMETHAMINE 30 MG/ML IJ SOLN: 30.0000 mg | Freq: Once | INTRAMUSCULAR | Status: DC

## 2015-02-09 MED ORDER — DOCUSATE SODIUM 100 MG PO CAPS
100.0000 mg | ORAL_CAPSULE | Freq: Two times a day (BID) | ORAL | Status: DC
  Administered 2015-02-10 (×2): 100 mg via ORAL
  Filled 2015-02-09 (×2): qty 1

## 2015-02-09 MED ORDER — KETOROLAC TROMETHAMINE 30 MG/ML IJ SOLN
INTRAMUSCULAR | Status: AC
  Filled 2015-02-09: qty 1

## 2015-02-09 MED ORDER — PROPOFOL 10 MG/ML IV BOLUS
INTRAVENOUS | Status: DC | PRN
  Administered 2015-02-09: 200 mg via INTRAVENOUS

## 2015-02-09 MED ORDER — BUPIVACAINE HCL (PF) 0.25 % IJ SOLN
INTRAMUSCULAR | Status: DC | PRN
  Administered 2015-02-09 (×3): 10 mL

## 2015-02-09 MED ORDER — LIDOCAINE HCL (CARDIAC) 20 MG/ML IV SOLN
INTRAVENOUS | Status: AC
  Filled 2015-02-09: qty 5

## 2015-02-09 MED ORDER — DEXAMETHASONE SODIUM PHOSPHATE 10 MG/ML IJ SOLN
INTRAMUSCULAR | Status: DC | PRN
  Administered 2015-02-09: 4 mg via INTRAVENOUS

## 2015-02-09 MED ORDER — CEFAZOLIN SODIUM-DEXTROSE 2-3 GM-% IV SOLR
INTRAVENOUS | Status: AC
  Filled 2015-02-09: qty 50

## 2015-02-09 MED ORDER — CEFAZOLIN SODIUM-DEXTROSE 2-3 GM-% IV SOLR
2.0000 g | INTRAVENOUS | Status: AC
  Administered 2015-02-09: 2 g via INTRAVENOUS

## 2015-02-09 MED ORDER — LIDOCAINE HCL (CARDIAC) 20 MG/ML IV SOLN
INTRAVENOUS | Status: DC | PRN
  Administered 2015-02-09: 100 mg via INTRAVENOUS

## 2015-02-09 MED ORDER — FENTANYL CITRATE (PF) 100 MCG/2ML IJ SOLN
INTRAMUSCULAR | Status: DC | PRN
  Administered 2015-02-09 (×3): 50 ug via INTRAVENOUS
  Administered 2015-02-09: 100 ug via INTRAVENOUS

## 2015-02-09 MED ORDER — LACTATED RINGERS IV SOLN
INTRAVENOUS | Status: DC
  Administered 2015-02-09: 125 mL/h via INTRAVENOUS
  Administered 2015-02-10: 05:00:00 via INTRAVENOUS

## 2015-02-09 MED ORDER — SODIUM CHLORIDE 0.9 % IJ SOLN
INTRAMUSCULAR | Status: DC | PRN
  Administered 2015-02-09: 10 mL

## 2015-02-09 MED ORDER — KETOROLAC TROMETHAMINE 30 MG/ML IJ SOLN
INTRAMUSCULAR | Status: DC | PRN
  Administered 2015-02-09: 30 mg via INTRAVENOUS

## 2015-02-09 MED ORDER — SIMETHICONE 80 MG PO CHEW: 80.0000 mg | CHEWABLE_TABLET | Freq: Four times a day (QID) | ORAL | Status: DC | PRN

## 2015-02-09 MED ORDER — SODIUM CHLORIDE 0.9 % IJ SOLN
INTRAMUSCULAR | Status: AC
  Filled 2015-02-09: qty 10

## 2015-02-09 MED ORDER — ACETAMINOPHEN 160 MG/5ML PO SOLN
975.0000 mg | Freq: Once | ORAL | Status: AC
  Administered 2015-02-09: 975 mg via ORAL

## 2015-02-09 MED ORDER — GLYCOPYRROLATE 0.2 MG/ML IJ SOLN
INTRAMUSCULAR | Status: DC | PRN
  Administered 2015-02-09: 0.6 mg via INTRAVENOUS

## 2015-02-09 MED ORDER — NEOSTIGMINE METHYLSULFATE 10 MG/10ML IV SOLN
INTRAVENOUS | Status: DC | PRN
  Administered 2015-02-09: 4 mg via INTRAVENOUS

## 2015-02-09 MED ORDER — LACTATED RINGERS IV SOLN: INTRAVENOUS | Status: DC

## 2015-02-09 MED ORDER — ONDANSETRON HCL 4 MG PO TABS: 4.0000 mg | ORAL_TABLET | Freq: Four times a day (QID) | ORAL | Status: DC | PRN

## 2015-02-09 MED ORDER — GLYCOPYRROLATE 0.2 MG/ML IJ SOLN
INTRAMUSCULAR | Status: AC
  Filled 2015-02-09: qty 3

## 2015-02-09 MED ORDER — ACETAMINOPHEN 160 MG/5ML PO SOLN
ORAL | Status: AC
  Administered 2015-02-09: 975 mg via ORAL
  Filled 2015-02-09: qty 40.6

## 2015-02-09 MED ORDER — ROCURONIUM BROMIDE 100 MG/10ML IV SOLN
INTRAVENOUS | Status: DC | PRN
  Administered 2015-02-09: 50 mg via INTRAVENOUS
  Administered 2015-02-09: 10 mg via INTRAVENOUS

## 2015-02-09 MED ORDER — BUPIVACAINE HCL (PF) 0.25 % IJ SOLN
INTRAMUSCULAR | Status: AC
  Filled 2015-02-09: qty 30

## 2015-02-09 MED ORDER — POLYETHYLENE GLYCOL 3350 17 G PO PACK: 17.0000 g | PACK | Freq: Every day | ORAL | Status: DC | PRN

## 2015-02-09 MED ORDER — LACTATED RINGERS IV SOLN
INTRAVENOUS | Status: DC
  Administered 2015-02-09 (×3): via INTRAVENOUS

## 2015-02-09 MED ORDER — ONDANSETRON HCL 4 MG/2ML IJ SOLN
INTRAMUSCULAR | Status: AC
  Filled 2015-02-09: qty 2

## 2015-02-09 MED ORDER — DIPHENHYDRAMINE HCL 25 MG PO CAPS: 25.0000 mg | ORAL_CAPSULE | Freq: Four times a day (QID) | ORAL | Status: DC | PRN

## 2015-02-09 MED ORDER — BISACODYL 5 MG PO TBEC
5.0000 mg | DELAYED_RELEASE_TABLET | Freq: Every day | ORAL | Status: DC | PRN
Start: 2015-02-09 — End: 2015-02-10
  Filled 2015-02-09: qty 1

## 2015-02-09 MED ORDER — MIDAZOLAM HCL 2 MG/2ML IJ SOLN
INTRAMUSCULAR | Status: AC
  Filled 2015-02-09: qty 4

## 2015-02-09 MED ORDER — FLUOXETINE HCL 20 MG PO CAPS
20.0000 mg | ORAL_CAPSULE | Freq: Every day | ORAL | Status: DC
  Administered 2015-02-10: 20 mg via ORAL
  Filled 2015-02-09 (×2): qty 1

## 2015-02-09 MED ORDER — VASOPRESSIN 20 UNIT/ML IV SOLN
INTRAVENOUS | Status: DC | PRN
  Administered 2015-02-09: 20 mL via INTRAMUSCULAR

## 2015-02-09 MED ORDER — SCOPOLAMINE 1 MG/3DAYS TD PT72
MEDICATED_PATCH | TRANSDERMAL | Status: AC
  Filled 2015-02-09: qty 1

## 2015-02-09 MED ORDER — HYDROMORPHONE HCL 1 MG/ML IJ SOLN
0.2500 mg | INTRAMUSCULAR | Status: DC | PRN
  Administered 2015-02-09 (×2): 0.5 mg via INTRAVENOUS

## 2015-02-09 SURGICAL SUPPLY — 43 items
CABLE HIGH FREQUENCY MONO STRZ (ELECTRODE) ×3 IMPLANT
CLEANER TIP ELECTROSURG 2X2 (MISCELLANEOUS) ×3 IMPLANT
CLOTH BEACON ORANGE TIMEOUT ST (SAFETY) ×3 IMPLANT
DRAPE SHEET LG 3/4 BI-LAMINATE (DRAPES) ×9 IMPLANT
DRSG COVADERM PLUS 2X2 (GAUZE/BANDAGES/DRESSINGS) IMPLANT
DRSG OPSITE POSTOP 3X4 (GAUZE/BANDAGES/DRESSINGS) IMPLANT
DURAPREP 26ML APPLICATOR (WOUND CARE) ×3 IMPLANT
ELECT LIGASURE SHORT 9 REUSE (ELECTRODE) IMPLANT
FORCEPS CUTTING 33CM 5MM (CUTTING FORCEPS) IMPLANT
GAUZE PACKING 1 X5 YD ST (GAUZE/BANDAGES/DRESSINGS) ×3 IMPLANT
GAUZE PACKING IODOFORM 2 (PACKING) IMPLANT
GAUZE SPONGE 4X4 16PLY XRAY LF (GAUZE/BANDAGES/DRESSINGS) ×3 IMPLANT
GLOVE BIOGEL PI IND STRL 6.5 (GLOVE) ×6 IMPLANT
GLOVE BIOGEL PI INDICATOR 6.5 (GLOVE) ×3
GLOVE ECLIPSE 6.5 STRL STRAW (GLOVE) ×9 IMPLANT
LIQUID BAND (GAUZE/BANDAGES/DRESSINGS) ×3 IMPLANT
NEEDLE INSUFFLATION 120MM (ENDOMECHANICALS) ×3 IMPLANT
PACK LAPAROSCOPY BASIN (CUSTOM PROCEDURE TRAY) ×3 IMPLANT
PACK ROBOTIC GOWN (GOWN DISPOSABLE) ×3 IMPLANT
PAD OB MATERNITY 4.3X12.25 (PERSONAL CARE ITEMS) ×3 IMPLANT
PAD POSITIONING PINK XL (MISCELLANEOUS) ×3 IMPLANT
PENCIL BUTTON HOLSTER BLD 10FT (ELECTRODE) ×3 IMPLANT
POUCH SPECIMEN RETRIEVAL 10MM (ENDOMECHANICALS) IMPLANT
SCISSORS LAP 5X35 DISP (ENDOMECHANICALS) ×3 IMPLANT
SET IRRIG TUBING LAPAROSCOPIC (IRRIGATION / IRRIGATOR) IMPLANT
SHEARS HARMONIC ACE PLUS 36CM (ENDOMECHANICALS) ×3 IMPLANT
SLEEVE XCEL OPT CAN 5 100 (ENDOMECHANICALS) ×3 IMPLANT
SPOGE SURGIFLO 8M (HEMOSTASIS)
SPONGE LAP 4X18 X RAY DECT (DISPOSABLE) ×3 IMPLANT
SPONGE SURGIFLO 8M (HEMOSTASIS) IMPLANT
STRIP CLOSURE SKIN 1/4X4 (GAUZE/BANDAGES/DRESSINGS) IMPLANT
SUT MON AB 4-0 PS1 27 (SUTURE) IMPLANT
SUT VIC AB 0 CT1 18XCR BRD8 (SUTURE) ×2 IMPLANT
SUT VIC AB 0 CT1 36 (SUTURE) ×6 IMPLANT
SUT VIC AB 0 CT1 8-18 (SUTURE) ×1
SUT VICRYL 0 TIES 12 18 (SUTURE) ×3 IMPLANT
TOWEL OR 17X24 6PK STRL BLUE (TOWEL DISPOSABLE) ×9 IMPLANT
TRAY FOLEY CATH SILVER 14FR (SET/KITS/TRAYS/PACK) ×3 IMPLANT
TROCAR XCEL NON-BLD 5MMX100MML (ENDOMECHANICALS) ×3 IMPLANT
TUBING NON-CON 1/4 X 20 CONN (TUBING) ×3 IMPLANT
WARMER LAPAROSCOPE (MISCELLANEOUS) ×3 IMPLANT
WATER STERILE IRR 1000ML POUR (IV SOLUTION) IMPLANT
YANKAUER SUCT BULB TIP NO VENT (SUCTIONS) ×3 IMPLANT

## 2015-02-09 NOTE — Anesthesia Preprocedure Evaluation (Addendum)
Anesthesia Evaluation  Patient identified by MRN, date of birth, ID band Patient awake    Reviewed: Allergy & Precautions, H&P , Patient's Chart, lab work & pertinent test results, reviewed documented beta blocker date and time   Airway Mallampati: II  TM Distance: >3 FB Neck ROM: full    Dental no notable dental hx.    Pulmonary    Pulmonary exam normal breath sounds clear to auscultation       Cardiovascular  Rhythm:regular Rate:Normal     Neuro/Psych    GI/Hepatic   Endo/Other    Renal/GU      Musculoskeletal   Abdominal   Peds  Hematology   Anesthesia Other Findings Multiple sclerosis..... Currently no Sx HTN  Fatigue.........Marland Kitchen related to MS   Shortness of breath dyspnea on exertion- lack of exercise  Depression    Anxiety     Headache          Reproductive/Obstetrics                            Anesthesia Physical Anesthesia Plan  ASA: II  Anesthesia Plan: General   Post-op Pain Management:    Induction: Intravenous  Airway Management Planned: Oral ETT  Additional Equipment:   Intra-op Plan:   Post-operative Plan: Extubation in OR  Informed Consent: I have reviewed the patients History and Physical, chart, labs and discussed the procedure including the risks, benefits and alternatives for the proposed anesthesia with the patient or authorized representative who has indicated his/her understanding and acceptance.   Dental Advisory Given and Dental advisory given  Plan Discussed with: CRNA and Surgeon  Anesthesia Plan Comments: (  Discussed general anesthesia, including possible nausea, instrumentation of airway, sore throat,pulmonary aspiration, etc. I asked if the were any outstanding questions, or  concerns before we proceeded. )        Anesthesia Quick Evaluation

## 2015-02-09 NOTE — Interval H&P Note (Signed)
History and Physical Interval Note:  02/09/2015 11:56 AM  [name]  has presented today for surgery, with the diagnosis of D25.1  Fibroids N92.0  heavy Menstrual Bleeding  The various methods of treatment have been discussed with the patient and family. After consideration of risks, benefits and other options for treatment, the patient has consented to  Procedure(s): LAPAROSCOPIC ASSISTED VAGINAL HYSTERECTOMY WITH SALPINGECTOMY (Bilateral) as a surgical intervention .  The patient's history has been reviewed, patient examined, no change in status, stable for surgery.  I have reviewed the patient's chart and labs.  Questions were answered to the patient's satisfaction.     Janyth Pupa, M

## 2015-02-09 NOTE — Op Note (Signed)
Preoperative Diagnosis: Pelvic organ prolapse and complex right adnexal cyst  Postoperative Diagnosis: same  Procedure: Laparoscopy Assisted Vaginal Hysterectomy, Bilateral salpingenctomy, McCall's culdoplasty Surgeon: Dr. Janyth Pupa  Assistant: Dr. Christophe Louis Anesthetic: General  IVF: 2400cc EBL: 200cc  UOP: 200cc  Specimens: 1) Uterus with bilateral fallopian tubes  Findings: No free fluid or omental studding appreciated. Normal appearing liver and bowel. 10wk sized anteverted uterus with 4cm penduculated fundal fibroid.  Bilateral fallopian tubes and ovaries appeared normal.    Procedure: The patient was taken to the operating room where general anesthesia was found to be adequate. The patient's abdomen was prepped with ChloraPrep. The perineum and vagina were prepped with multiple layers of Betadine. The patient was sterilely draped. A Foley catheter was placed in the bladder. A Hulka tenaculum was placed inside the uterus. Gown and gloves were changed and attention was turned to the abdomen. The umbilicus was injected with quarter percent marcaine (10cc) and an infraumbilcial incision was made.  The Veress needle was inserted into the abdominal cavity without difficulty and proper placement was confirmed using the saline drop test.  Opening pressure was 70mmHg. A pneumoperitoneum was obtained. The laparoscopic trocar and the laparoscope were placed under direct visualization. Two additional ports were placed in the right and left lower quadrants. Each area was injected with Marcaine, a small incision was made and a 5 mm trocar was inserted into the abdominal cavity under direct visualization. An abdominal scan was performed with the findings noted above.   Attention was turned to the left adnexa and the ureter was idenitifed. The left fallopian tube was clamped, ligated and dissected using the Harmonic. Serial ligation was performed up to the proximal portion of the utero-ovarian ligament and  cornua of the fallopian tube. The left round ligament was then grasped, elevated, fulgurated and divided using the Harmonic. The anterior leaf of the broad ligament was incised inferiorly and superiorly and the retroperitoneal space was developed. The anterior leaf of the broad ligament was dissected medially and inferiorly to allow dissection of the vesicouterine flap. Attention was turned to the rigth adnexa. In a similar fashion, the right fallopian tube was serially ligated using the Harmonic.  The utero-ovarian was also ligated for preservation of the right ovary.  The right round ligament was grasped and ligated and the vesicouterine fold of peritoneum was incised anteriorly for full dissection of the vesicouterine flap. Hemostasis was noted. The case then proceeded to the vaginal portion.  The Hulka was removed and a weighted speculum was placed in the posterior vagina. The cervix was injected with 1:100 dilute solution of Petrussin. The cervix was circumferentially incised with the bovie and the bladder was dissected off the pubovesical cervical fascia. The anterior cul-de-sac was entered sharply. The same procedure was performed posteriorly and the posterior cu-lde-sac was entered sharply without difficulty. A heany clamp was placed over the uterosacral ligaments bilaterally. These were transected and suture ligated with 0 vicryl. The cardinal ligaments were then clamped bilaterally and transected and suture ligated in a similar fashion. The uterine arteries and broad ligament was then serially clamped with heany clamps, transected and suture ligated bilaterally. The uterus and bilateral fallopian tubes were removed from the operative field and sent to pathology. Hemostasis was confirmed.  McCall's culdoplasty was performed using 0-vicryl in the usual fashion.  A final check was made for hemostasis and confirmed. The vaginal cuff was closed in a running locked fashion using 0 vicryl. Vaginal packing was  placed. Gown and  gloves were changed and attention was turned to the abdomen.  The pneumoperitoneum was reestablished. The pelvis was irrigated and inspected and adequate hemostasi was noted.   The trocars were removed under direct visualization. The pneumoperitoneum was allowed to escape. Dermabond was placed over the three trocar sites. The patient tolerated her procedure well. She was awakened from her anesthetic without difficulty and then transported to the recovery room in stable condition. Sponge, needle, and instrument counts were correct. Dr. Landry Mellow was present for the case to assist due to the complexity of the surgery and since residents are not available to our service.  Janyth Pupa, DO  [phone] (pager)  [phone] (office)

## 2015-02-09 NOTE — Anesthesia Procedure Notes (Signed)
Procedure Name: Intubation Date/Time: 02/09/2015 12:19 PM Performed by: Riki Sheer Pre-anesthesia Checklist: Patient identified, Emergency Drugs available, Suction available, Patient being monitored and Timeout performed Patient Re-evaluated:Patient Re-evaluated prior to inductionOxygen Delivery Method: Circle system utilized Preoxygenation: Pre-oxygenation with 100% oxygen Intubation Type: IV induction Ventilation: Mask ventilation without difficulty Laryngoscope Size: Miller and 2 Grade View: Grade I Tube type: Oral Tube size: 7.0 mm Number of attempts: 1 Airway Equipment and Method: Stylet Placement Confirmation: ETT inserted through vocal cords under direct vision,  positive ETCO2,  CO2 detector and breath sounds checked- equal and bilateral Secured at: 21 cm Tube secured with: Tape Dental Injury: Teeth and Oropharynx as per pre-operative assessment

## 2015-02-09 NOTE — Transfer of Care (Signed)
Immediate Anesthesia Transfer of Care Note  Patient: [name]  Procedure(s) Performed: Procedure(s): LAPAROSCOPIC ASSISTED VAGINAL HYSTERECTOMY  BILATERAL SALPINGECTOMY (Bilateral)  Patient Location: PACU  Anesthesia Type:General  Level of Consciousness: sedated  Airway & Oxygen Therapy: Patient Spontanous Breathing and Patient connected to nasal cannula oxygen  Post-op Assessment: Report given to RN and Post -op Vital signs reviewed and stable  Post vital signs: Reviewed and stable  Last Vitals:  Filed Vitals:   02/09/15 1101  BP: 145/80  Pulse: 82  Temp: 37.6 C  Resp: 18    Complications: No apparent anesthesia complications

## 2015-02-10 ENCOUNTER — Encounter (HOSPITAL_COMMUNITY): Payer: Self-pay | Admitting: Obstetrics & Gynecology

## 2015-02-10 DIAGNOSIS — G35 Multiple sclerosis: Secondary | ICD-10-CM | POA: Diagnosis not present

## 2015-02-10 DIAGNOSIS — E780 Pure hypercholesterolemia: Secondary | ICD-10-CM | POA: Diagnosis not present

## 2015-02-10 DIAGNOSIS — Z23 Encounter for immunization: Secondary | ICD-10-CM | POA: Diagnosis not present

## 2015-02-10 DIAGNOSIS — D259 Leiomyoma of uterus, unspecified: Secondary | ICD-10-CM | POA: Diagnosis not present

## 2015-02-10 DIAGNOSIS — F419 Anxiety disorder, unspecified: Secondary | ICD-10-CM | POA: Diagnosis not present

## 2015-02-10 DIAGNOSIS — F329 Major depressive disorder, single episode, unspecified: Secondary | ICD-10-CM | POA: Diagnosis not present

## 2015-02-10 LAB — BASIC METABOLIC PANEL
ANION GAP: 5 (ref 5–15)
BUN: 7 mg/dL (ref 6–20)
CALCIUM: 8.3 mg/dL — AB (ref 8.9–10.3)
CO2: 29 mmol/L (ref 22–32)
Chloride: 103 mmol/L (ref 101–111)
Creatinine, Ser: 0.76 mg/dL (ref 0.44–1.00)
GLUCOSE: 109 mg/dL — AB (ref 65–99)
POTASSIUM: 3.9 mmol/L (ref 3.5–5.1)
Sodium: 137 mmol/L (ref 135–145)

## 2015-02-10 LAB — CBC
HEMATOCRIT: 31.2 % — AB (ref 36.0–46.0)
Hemoglobin: 9.7 g/dL — ABNORMAL LOW (ref 12.0–15.0)
MCH: 23.7 pg — ABNORMAL LOW (ref 26.0–34.0)
MCHC: 31.1 g/dL (ref 30.0–36.0)
MCV: 76.3 fL — AB (ref 78.0–100.0)
PLATELETS: 269 10*3/uL (ref 150–400)
RBC: 4.09 MIL/uL (ref 3.87–5.11)
RDW: 21.4 % — AB (ref 11.5–15.5)
WBC: 9 10*3/uL (ref 4.0–10.5)

## 2015-02-10 MED ORDER — IBUPROFEN 600 MG PO TABS: 600.0000 mg | ORAL_TABLET | Freq: Four times a day (QID) | ORAL | Status: DC

## 2015-02-10 MED ORDER — INFLUENZA VAC SPLIT QUAD 0.5 ML IM SUSY
0.5000 mL | PREFILLED_SYRINGE | Freq: Once | INTRAMUSCULAR | Status: AC
  Administered 2015-02-10: 0.5 mL via INTRAMUSCULAR
  Filled 2015-02-10: qty 0.5

## 2015-02-10 MED ORDER — INFLUENZA VAC SPLIT QUAD 0.5 ML IM SUSY: 0.5000 mL | PREFILLED_SYRINGE | INTRAMUSCULAR | Status: DC

## 2015-02-10 MED ORDER — DOCUSATE SODIUM 100 MG PO CAPS: 100.0000 mg | ORAL_CAPSULE | Freq: Two times a day (BID) | ORAL | Status: DC

## 2015-02-10 MED ORDER — IBUPROFEN 600 MG PO TABS
600.0000 mg | ORAL_TABLET | Freq: Four times a day (QID) | ORAL | Status: DC
  Administered 2015-02-10: 600 mg via ORAL
  Filled 2015-02-10: qty 1

## 2015-02-10 MED ORDER — OXYCODONE-ACETAMINOPHEN 5-325 MG PO TABS: 1.0000 | ORAL_TABLET | Freq: Four times a day (QID) | ORAL | Status: DC | PRN

## 2015-02-10 NOTE — Progress Notes (Signed)
Pt out in wheelchair teaching complete  

## 2015-02-10 NOTE — Discharge Instructions (Signed)
Laparoscopically Assisted Vaginal Hysterectomy, Care After Refer to this sheet in the next few weeks. These instructions provide you with information on caring for yourself after your procedure. Your health care provider may also give you more specific instructions. Your treatment has been planned according to current medical practices, but problems sometimes occur. Call your health care provider if you have any problems or questions after your procedure. WHAT TO EXPECT AFTER THE PROCEDURE After your procedure, it is typical to have the following:  Abdominal pain. You will be given pain medicine to control it.  Sore throat from the breathing tube that was inserted during surgery. HOME CARE INSTRUCTIONS  Only take over-the-counter or prescription medicines for pain, discomfort, or fever as directed by your health care provider.  For pain management, please alternate between Percocet and Motrin. Percocet may cause constipation-while on this medication please take colac (stool softener) twice daily as needed.  For your blood pressure please hold on this medication for ~1 week.  If you have a headache or swelling, you may take your blood pressure at home.  Otherwise, check your blood pressure daily about a week after surgery.  If blood pressure is higher than 140/90, you may restart your BP medication.  Do not take aspirin. It can cause bleeding.  Do not drive when taking pain medicine.  Follow your health care provider's advice regarding diet, exercise, lifting, driving, and general activities.  Resume your usual diet as directed and allowed.  Get plenty of rest and sleep.  Do not douche, use tampons, or have sexual intercourse for at least 6 weeks, or until your health care provider gives you permission.  Change your bandages (dressings) as directed by your health care provider.  Monitor your temperature and notify your health care provider of a fever.  Take showers instead of baths for  2-3 weeks.  Do not drink alcohol until your health care provider gives you permission.  If you develop constipation, you may take a mild laxative with your health care provider's permission. Bran foods may help with constipation problems. Drinking enough fluids to keep your urine clear or pale yellow may help as well.  Try to have someone home with you for 1-2 weeks to help around the house.  Keep all of your follow-up appointments as directed by your health care provider. SEEK MEDICAL CARE IF:   You have swelling, redness, or increasing pain around your incision sites.  You have pus coming from your incision.  You notice a bad smell coming from your incision.  Your incision breaks open.  You feel dizzy or lightheaded.  You have pain or bleeding when you urinate.  You have persistent diarrhea.  You have persistent nausea and vomiting.  You have abnormal vaginal discharge.  You have a rash.  You have any type of abnormal reaction or develop an allergy to your medicine.  You have poor pain control with your prescribed medicine. SEEK IMMEDIATE MEDICAL CARE IF:   You have a fever.  You have severe abdominal pain.  You have chest pain.  You have shortness of breath.  You faint.  You have pain, swelling, or redness in your leg.  You have heavy vaginal bleeding with blood clots. MAKE SURE YOU:  Understand these instructions.  Will watch your condition.  Will get help right away if you are not doing well or get worse. Document Released: 05/10/2011 Document Revised: 05/26/2013 Document Reviewed: 12/04/2012 [facility] Patient Information 2015 [place], [place]. This information is not intended  to replace advice given to you by your health care provider. Make sure you discuss any questions you have with your health care provider. ° °

## 2015-02-10 NOTE — Progress Notes (Signed)
Postoperative Note Day # 1  S:  Patient resting comfortable in bed.  Pain controlled.  Tolerating light general diet. No flatus, no BM.  Bleeding minimal.  Up to chair overnight.  She denies n/v/f/c, SOB, or CP.  Foley removed this am.   O: Temp:  [97.7 F (36.5 C)-99.7 F (37.6 C)] 97.7 F (36.5 C) (09/08 1610) Pulse Rate:  [67-102] 67 (09/08 0613) Resp:  [10-18] 18 (09/08 0613) BP: (117-145)/(58-80) 117/58 mmHg (09/08 0613) SpO2:  [94 %-100 %] 94 % (09/08 0613) Weight:  [89.812 kg (198 lb)] 89.812 kg (198 lb) (09/07 2300)  UOP: 950cc/8hr  Gen: A&Ox3, NAD CV: RRR, no MRG Resp: CTAB Abdomen: soft, NT, ND +BS Incision: c/d/i with dermabond Ext: No edema, no calf tenderness bilaterally, SCDs in place  Labs:  Results for orders placed or performed during the hospital encounter of 02/09/15 (from the past 24 hour(s))  Type and screen     Status: None   Collection Time: 02/09/15 11:00 AM  Result Value Ref Range   ABO/RH(D) O POS    Antibody Screen NEG    Sample Expiration 02/12/2015   CBC     Status: Abnormal   Collection Time: 02/09/15 11:00 AM  Result Value Ref Range   WBC 7.5 4.0 - 10.5 K/uL   RBC 4.44 3.87 - 5.11 MIL/uL   Hemoglobin 10.6 (L) 12.0 - 15.0 g/dL   HCT 33.6 (L) 36.0 - 46.0 %   MCV 75.7 (L) 78.0 - 100.0 fL   MCH 23.9 (L) 26.0 - 34.0 pg   MCHC 31.5 30.0 - 36.0 g/dL   RDW 22.6 (H) 11.5 - 15.5 %   Platelets 297 150 - 400 K/uL  hCG, serum, qualitative     Status: None   Collection Time: 02/09/15 11:00 AM  Result Value Ref Range   Preg, Serum NEGATIVE NEGATIVE  CBC     Status: Abnormal   Collection Time: 02/10/15  5:18 AM  Result Value Ref Range   WBC 9.0 4.0 - 10.5 K/uL   RBC 4.09 3.87 - 5.11 MIL/uL   Hemoglobin 9.7 (L) 12.0 - 15.0 g/dL   HCT 31.2 (L) 36.0 - 46.0 %   MCV 76.3 (L) 78.0 - 100.0 fL   MCH 23.7 (L) 26.0 - 34.0 pg   MCHC 31.1 30.0 - 36.0 g/dL   RDW 21.4 (H) 11.5 - 15.5 %   Platelets 269 150 - 400 K/uL  Basic metabolic panel     Status:  Abnormal   Collection Time: 02/10/15  5:18 AM  Result Value Ref Range   Sodium 137 135 - 145 mmol/L   Potassium 3.9 3.5 - 5.1 mmol/L   Chloride 103 101 - 111 mmol/L   CO2 29 22 - 32 mmol/L   Glucose, Bld 109 (H) 65 - 99 mg/dL   BUN 7 6 - 20 mg/dL   Creatinine, Ser 0.76 0.44 - 1.00 mg/dL   Calcium 8.3 (L) 8.9 - 10.3 mg/dL   GFR calc non Af Amer >60 >60 mL/min   GFR calc Af Amer >60 >60 mL/min   Anion gap 5 5 - 15    A/P: Pt is a 44 y.o. G2P2 s/p LAVH, BS, POD#1  - Pain well controlled, will transition to oral medications (percocet and motrin) -GU: UOP is adequate, pt to void this am -GI: Advance to general diet today. -Activity: encouraged sitting up to chair and ambulation as tolerated -Prophylaxis: SCDs while in bed, encourage early ambulation -Labs: stable as  above  Patient meeting postoperative milestones appropriately.  Once pt is able to void today and increase her activity and diet will plan for discharge home later today.  Janyth Pupa, DO [phone] (pager) [phone] (office)

## 2015-02-10 NOTE — Anesthesia Postprocedure Evaluation (Signed)
  Anesthesia Post-op Note  Patient: [name]  Procedure(s) Performed: Procedure(s): LAPAROSCOPIC ASSISTED VAGINAL HYSTERECTOMY  BILATERAL SALPINGECTOMY (Bilateral)  Patient Location: PACU and [facility] Unit  Anesthesia Type:General  Level of Consciousness: awake, alert  and oriented  Airway and Oxygen Therapy: Patient Spontanous Breathing  Post-op Pain: mild  Post-op Assessment: Patient's Cardiovascular Status Stable, Respiratory Function Stable, No signs of Nausea or vomiting, Adequate PO intake and Pain level controlled              Post-op Vital Signs: Reviewed and stable  Last Vitals:  Filed Vitals:   02/10/15 0613  BP: 117/58  Pulse: 67  Temp: 36.5 C  Resp: 18    Complications: No apparent anesthesia complications

## 2015-02-11 NOTE — Discharge Summary (Signed)
Physician Discharge Summary  Patient ID: [name] MRN: [id number] DOB/AGE: [birth year] 44 y.o.  Admit date: 02/09/2015 Discharge date: 02/10/2015  Admission Diagnoses: Abnormal uterine bleeding, Anemia, Uterine leiomyoma  Discharge Diagnoses: same Discharged Condition: stable  Hospital Course: 44yo G2P2 who was presented for surgical management of her AUB, anemia and uterine fibroids.  Pt underwent a LAVH, BS, McCall's culdoplasty- for information regarding the procedure, please see the operative report.  Her postoperative course was uncomplicated and she was discharged home on POD #1 in stable condition.    Consults: None  Significant Diagnostic Studies: labs:  CBC Latest Ref Rng 02/10/2015 02/09/2015 01/18/2015  WBC 4.0 - 10.5 K/uL 9.0 7.5 8.3  Hemoglobin 12.0 - 15.0 g/dL 9.7(L) 10.6(L) 9.4(L)  Hematocrit 36.0 - 46.0 % 31.2(L) 33.6(L) 30.6(L)  Platelets 150 - 400 K/uL 269 297 333    Treatments: IV hydration, antibiotics: Ancef, analgesia: Dilaudid, Toradol and surgery: Laprascopic assisted vaginal hysterectomy, bilateral salpingectomy, McCall's culdoplasty  Discharge Exam: Blood pressure 121/69, pulse 66, temperature 98.4 F (36.9 C), temperature source Oral, resp. rate 18, height 5\' 6"  (1.676 m), weight 89.812 kg (198 lb), SpO2 99 %. Gen: A&Ox3, NAD CV: RRR, no MRG Resp: CTAB Abdomen: soft, NT, ND +BS Incision: c/d/i with dermabond Ext: No edema, no calf tenderness bilaterally, SCDs in place  Disposition: 01-Home or Self Care     Medication List    TAKE these medications        docusate sodium 100 MG capsule  Commonly known as:  COLACE  Take 1 capsule (100 mg total) by mouth 2 (two) times daily.     FLUoxetine 20 MG capsule  Commonly known as:  PROZAC  Take 20 mg by mouth daily.     ibuprofen 600 MG tablet  Commonly known as:  ADVIL,MOTRIN  Take 1 tablet (600 mg total) by mouth every 6 (six) hours.     methylphenidate 10 MG tablet  Commonly known as:  RITALIN   Take 10 mg by mouth daily.     norgestimate-ethinyl estradiol 0.25-35 MG-MCG tablet  Commonly known as:  ORTHO-CYCLEN,SPRINTEC,PREVIFEM  Take 1 tablet by mouth daily.     oxyCODONE-acetaminophen 5-325 MG per tablet  Commonly known as:  PERCOCET/ROXICET  Take 1-2 tablets by mouth every 6 (six) hours as needed for moderate pain (moderate to severe pain (when tolerating fluids)).     triamterene-hydrochlorothiazide 37.5-25 MG per tablet  Commonly known as:  MAXZIDE-25  Take 1 tablet by mouth daily.     TYSABRI IV  Inject 1 Syringe into the vein every 30 (thirty) days. Will receive on 8/25, Friday.     Vitamin D3 5000 UNITS Caps  Take 1 capsule by mouth daily.           Follow-up Information    Follow up with Janyth Pupa, M, DO In 2 weeks.   Specialty:  Obstetrics and Gynecology   Contact information:   [address] [place] [address] [phone]       Signed: Annalee Genta 02/11/2015, 7:17 AM

## 2015-02-23 DIAGNOSIS — N898 Other specified noninflammatory disorders of vagina: Secondary | ICD-10-CM | POA: Diagnosis not present

## 2015-02-23 DIAGNOSIS — Z4889 Encounter for other specified surgical aftercare: Secondary | ICD-10-CM | POA: Diagnosis not present

## 2015-02-24 DIAGNOSIS — G35 Multiple sclerosis: Secondary | ICD-10-CM | POA: Diagnosis not present

## 2015-02-24 DIAGNOSIS — Z418 Encounter for other procedures for purposes other than remedying health state: Secondary | ICD-10-CM | POA: Diagnosis not present

## 2015-02-24 DIAGNOSIS — Z79899 Other long term (current) drug therapy: Secondary | ICD-10-CM | POA: Diagnosis not present

## 2015-03-04 DIAGNOSIS — D72819 Decreased white blood cell count, unspecified: Secondary | ICD-10-CM | POA: Diagnosis not present

## 2015-03-04 DIAGNOSIS — I10 Essential (primary) hypertension: Secondary | ICD-10-CM | POA: Diagnosis not present

## 2015-03-04 DIAGNOSIS — G35 Multiple sclerosis: Secondary | ICD-10-CM | POA: Diagnosis not present

## 2015-04-07 DIAGNOSIS — G35 Multiple sclerosis: Secondary | ICD-10-CM | POA: Diagnosis not present

## 2015-04-08 ENCOUNTER — Ambulatory Visit (HOSPITAL_COMMUNITY)
Admission: RE | Admit: 2015-04-08 | Discharge: 2015-04-08 | Disposition: A | Discharge status: Home / Self Care | Payer: Medicare Other | Source: Ambulatory Visit | Attending: Psychiatry | Admitting: Psychiatry

## 2015-04-08 DIAGNOSIS — G35 Multiple sclerosis: Secondary | ICD-10-CM | POA: Diagnosis not present

## 2015-04-08 MED ORDER — SODIUM CHLORIDE 0.9 % IV SOLN
1000.0000 mg | Freq: Once | INTRAVENOUS | Status: AC
  Administered 2015-04-08: 1000 mg via INTRAVENOUS
  Filled 2015-04-08: qty 8

## 2015-04-08 NOTE — Progress Notes (Signed)
Pt arrived for infusion of Solumedrol IV; @ 1430, pt stated that her right arm felt tingly and sore at IV insertion site; IV assessed for redness and infiltration; no complications noted; pt infusion stopped with 10 cc remaining in bag; pt's IV site flushed with normal saline; pt states that no more tingling in arm, states hand is sore where IV was inserted; IV removed Procedure Diagnosis: Multiple sclerosis Ordering MD: Delphia Grates, MD

## 2015-04-11 ENCOUNTER — Ambulatory Visit (HOSPITAL_COMMUNITY)
Admission: RE | Admit: 2015-04-11 | Discharge: 2015-04-11 | Disposition: A | Discharge status: Home / Self Care | Payer: Medicare Other | Source: Ambulatory Visit | Attending: Psychiatry | Admitting: Psychiatry

## 2015-04-11 DIAGNOSIS — G35 Multiple sclerosis: Secondary | ICD-10-CM | POA: Diagnosis not present

## 2015-04-11 MED ORDER — SODIUM CHLORIDE 0.9 % IV SOLN
1000.0000 mg | Freq: Once | INTRAVENOUS | Status: AC
  Administered 2015-04-11: 1000 mg via INTRAVENOUS
  Filled 2015-04-11: qty 8

## 2015-04-11 NOTE — Procedures (Signed)
[facility]  Procedure Note  [name] NTI:[id number] DOB: [birth year] DOA: 04/11/2015   PCP: Jonathon Bellows, MD   Associated Diagnosis: Multiple sclerosis exacerbation  Procedure Note:  IV infusion of solumedrol   Condition During Procedure:  Pt tolerated well     Condition at Discharge:  No complications noted   Nigel Sloop, [facility]

## 2015-04-12 ENCOUNTER — Ambulatory Visit (HOSPITAL_COMMUNITY)
Admission: RE | Admit: 2015-04-12 | Discharge: 2015-04-12 | Disposition: A | Discharge status: Home / Self Care | Payer: Medicare Other | Source: Ambulatory Visit | Attending: Psychiatry | Admitting: Psychiatry

## 2015-04-12 DIAGNOSIS — G35 Multiple sclerosis: Secondary | ICD-10-CM | POA: Diagnosis not present

## 2015-04-12 MED ORDER — SODIUM CHLORIDE 0.9 % IV SOLN
1000.0000 mg | Freq: Once | INTRAVENOUS | Status: AC
  Administered 2015-04-12: 1000 mg via INTRAVENOUS
  Filled 2015-04-12: qty 8

## 2015-04-12 NOTE — Progress Notes (Signed)
[name] TTC:[id number] DOB: December 13, 1970 DOA: 04/12/2015   PCP: Jonathon Bellows, MD   Associated Diagnosis: Multiple sclerosis exacerbation  Procedure Note: IV infusion of solumedrol   Condition During Procedure: Pt tolerated well    Condition at Discharge: No complications noted

## 2015-04-13 ENCOUNTER — Ambulatory Visit (HOSPITAL_COMMUNITY)
Admission: RE | Admit: 2015-04-13 | Discharge: 2015-04-13 | Disposition: A | Discharge status: Home / Self Care | Payer: Medicare Other | Source: Ambulatory Visit | Attending: Psychiatry | Admitting: Psychiatry

## 2015-04-13 DIAGNOSIS — G35 Multiple sclerosis: Secondary | ICD-10-CM | POA: Diagnosis not present

## 2015-04-13 MED ORDER — SODIUM CHLORIDE 0.9 % IV SOLN
1000.0000 mg | Freq: Once | INTRAVENOUS | Status: AC
  Administered 2015-04-13: 1000 mg via INTRAVENOUS
  Filled 2015-04-13: qty 8

## 2015-04-13 NOTE — Progress Notes (Signed)
[name] REV:[id number] DOB: 1971/04/14 DOA: 04/12/2015   PCP: Jonathon Bellows, MD   Associated Diagnosis: Multiple sclerosis exacerbation  Procedure Note: IV infusion of solumedrol   Condition During Procedure: Pt tolerated well    Condition at Discharge: No complications noted

## 2015-04-14 ENCOUNTER — Ambulatory Visit (HOSPITAL_COMMUNITY)
Admission: RE | Admit: 2015-04-14 | Discharge: 2015-04-14 | Disposition: A | Discharge status: Home / Self Care | Payer: Medicare Other | Source: Ambulatory Visit | Attending: Psychiatry | Admitting: Psychiatry

## 2015-04-14 DIAGNOSIS — G35 Multiple sclerosis: Secondary | ICD-10-CM | POA: Diagnosis not present

## 2015-04-14 MED ORDER — SODIUM CHLORIDE 0.9 % IV SOLN
1000.0000 mg | Freq: Once | INTRAVENOUS | Status: AC
  Administered 2015-04-14: 1000 mg via INTRAVENOUS
  Filled 2015-04-14: qty 8

## 2015-04-14 NOTE — Progress Notes (Signed)
[name] [id number] DOB: 15-Jul-1970 DOA: 04/14/2015   PCP: Jonathon Bellows, MD   Associated Diagnosis: Multiple sclerosis exacerbation  Procedure Note: IV infusion of solumedrol   Condition During Procedure: Pt tolerated well    Condition at Discharge: No complications noted

## 2015-04-20 DIAGNOSIS — E559 Vitamin D deficiency, unspecified: Secondary | ICD-10-CM | POA: Diagnosis not present

## 2015-04-20 DIAGNOSIS — F329 Major depressive disorder, single episode, unspecified: Secondary | ICD-10-CM | POA: Diagnosis not present

## 2015-04-20 DIAGNOSIS — Z0000 Encounter for general adult medical examination without abnormal findings: Secondary | ICD-10-CM | POA: Diagnosis not present

## 2015-04-20 DIAGNOSIS — G35 Multiple sclerosis: Secondary | ICD-10-CM | POA: Diagnosis not present

## 2015-04-20 DIAGNOSIS — E7800 Pure hypercholesterolemia, unspecified: Secondary | ICD-10-CM | POA: Diagnosis not present

## 2015-04-20 DIAGNOSIS — D509 Iron deficiency anemia, unspecified: Secondary | ICD-10-CM | POA: Diagnosis not present

## 2015-04-20 DIAGNOSIS — I10 Essential (primary) hypertension: Secondary | ICD-10-CM | POA: Diagnosis not present

## 2015-05-09 ENCOUNTER — Ambulatory Visit (HOSPITAL_COMMUNITY)
Admission: RE | Admit: 2015-05-09 | Discharge: 2015-05-09 | Disposition: A | Discharge status: Home / Self Care | Payer: Medicare Other | Source: Ambulatory Visit | Attending: Psychiatry | Admitting: Psychiatry

## 2015-05-09 DIAGNOSIS — G35 Multiple sclerosis: Secondary | ICD-10-CM | POA: Diagnosis not present

## 2015-05-09 MED ORDER — SODIUM CHLORIDE 0.9 % IV SOLN
300.0000 mg | Freq: Once | INTRAVENOUS | Status: AC
  Administered 2015-05-09: 300 mg via INTRAVENOUS
  Filled 2015-05-09: qty 15

## 2015-05-09 NOTE — Procedures (Signed)
[facility]  Procedure Note  LORAY MAPLES [id number] DOB: 02/13/1971 DOA: 05/09/2015   PCP: Jonathon Bellows, MD   Associated Diagnosis: Multiple sclerosis  Procedure Note: IV infusion of Tysabri   Condition During Procedure:  Pt tolerated well; Pt observed for one hour post infusion   Condition at Discharge: Pt alert, oriented, ambulatory; no complications noted   Nigel Sloop, [facility]

## 2015-06-03 DIAGNOSIS — G35 Multiple sclerosis: Secondary | ICD-10-CM | POA: Diagnosis not present

## 2015-06-03 DIAGNOSIS — I10 Essential (primary) hypertension: Secondary | ICD-10-CM | POA: Diagnosis not present

## 2015-06-03 DIAGNOSIS — D72819 Decreased white blood cell count, unspecified: Secondary | ICD-10-CM | POA: Diagnosis not present

## 2015-06-09 ENCOUNTER — Ambulatory Visit (HOSPITAL_COMMUNITY)
Admission: RE | Admit: 2015-06-09 | Discharge: 2015-06-09 | Disposition: A | Discharge status: Home / Self Care | Payer: Medicare Other | Source: Ambulatory Visit | Attending: Psychiatry | Admitting: Psychiatry

## 2015-06-09 DIAGNOSIS — G35 Multiple sclerosis: Secondary | ICD-10-CM | POA: Diagnosis not present

## 2015-06-09 MED ORDER — SODIUM CHLORIDE 0.9 % IV SOLN
300.0000 mg | INTRAVENOUS | Status: DC
  Administered 2015-06-09: 300 mg via INTRAVENOUS
  Filled 2015-06-09: qty 15

## 2015-06-09 NOTE — Progress Notes (Signed)
[name] [id number] DOB: 01-Aug-1970 DOA: 06/09/2015   PCP: Dr Effie Shy Associated Diagnosis: Multiple sclerosis exacerbation  Procedure Note: IV infusion of Tysabri   Condition During Procedure: Pt tolerated well    Condition at Discharge: No complications noted

## 2015-06-28 DIAGNOSIS — G35 Multiple sclerosis: Secondary | ICD-10-CM | POA: Diagnosis not present

## 2015-06-28 DIAGNOSIS — D3131 Benign neoplasm of right choroid: Secondary | ICD-10-CM | POA: Diagnosis not present

## 2015-06-28 DIAGNOSIS — H40013 Open angle with borderline findings, low risk, bilateral: Secondary | ICD-10-CM | POA: Diagnosis not present

## 2015-07-07 ENCOUNTER — Ambulatory Visit (HOSPITAL_COMMUNITY)
Admission: RE | Admit: 2015-07-07 | Discharge: 2015-07-07 | Disposition: A | Discharge status: Home / Self Care | Payer: Medicare Other | Source: Ambulatory Visit | Attending: Family Medicine | Admitting: Family Medicine

## 2015-07-07 DIAGNOSIS — G35 Multiple sclerosis: Secondary | ICD-10-CM | POA: Insufficient documentation

## 2015-07-07 MED ORDER — SODIUM CHLORIDE 0.9 % IV SOLN
300.0000 mg | INTRAVENOUS | Status: DC
  Administered 2015-07-07: 300 mg via INTRAVENOUS
  Filled 2015-07-07: qty 15

## 2015-07-07 NOTE — Progress Notes (Addendum)
[name] [id number] DOB: 08/31/1970 DOA: 07/07/2015   PCP: Dr Effie Shy Associated Diagnosis: Multiple sclerosis exacerbation  Procedure Note: IV infusion of Tysabri   Condition During Procedure: Pt tolerated well, pt was observed for one hour post infusion.   Condition at Discharge: No complications noted

## 2015-08-04 ENCOUNTER — Ambulatory Visit (HOSPITAL_COMMUNITY)
Admission: RE | Admit: 2015-08-04 | Discharge: 2015-08-04 | Disposition: A | Discharge status: Home / Self Care | Payer: Medicare Other | Source: Ambulatory Visit | Attending: Psychiatry | Admitting: Psychiatry

## 2015-08-04 DIAGNOSIS — G35 Multiple sclerosis: Secondary | ICD-10-CM | POA: Diagnosis not present

## 2015-08-04 MED ORDER — SODIUM CHLORIDE 0.9 % IV SOLN
300.0000 mg | Freq: Once | INTRAVENOUS | Status: AC
  Administered 2015-08-04: 300 mg via INTRAVENOUS
  Filled 2015-08-04: qty 15

## 2015-08-04 NOTE — Progress Notes (Signed)
PCP: Dr Effie Shy Associated Diagnosis: Multiple sclerosis exacerbation  Procedure Note: IV infusion of Tysabri   Condition During Procedure: Pt tolerated well, pt was observed for one hour post infusion.   Condition at Discharge: No complications noted

## 2015-09-01 ENCOUNTER — Ambulatory Visit (HOSPITAL_COMMUNITY)
Admission: RE | Admit: 2015-09-01 | Discharge: 2015-09-01 | Disposition: A | Discharge status: Home / Self Care | Payer: Medicare Other | Source: Ambulatory Visit | Attending: Family Medicine | Admitting: Family Medicine

## 2015-09-01 DIAGNOSIS — G35 Multiple sclerosis: Secondary | ICD-10-CM | POA: Diagnosis not present

## 2015-09-01 MED ORDER — SODIUM CHLORIDE 0.9 % IV SOLN
300.0000 mg | Freq: Once | INTRAVENOUS | Status: AC
  Administered 2015-09-01: 300 mg via INTRAVENOUS
  Filled 2015-09-01: qty 15

## 2015-09-01 NOTE — Discharge Instructions (Signed)
Tysabri infusion

## 2015-09-01 NOTE — Progress Notes (Signed)
PCP: Effie Shy MD  Associated Diagnosis: Multiple Sclerosis Exacerbation  Procedure: IV infusion of Tysabri  Condition during procedure: Patient tolerated infusion well. Patient was observed for 1 hour post transfusion. No adverse symptoms noted. Went over discharge instructions and gave the instructions to patient. Alert, oriented and ambulatory at time of discharge. Discharged to home.

## 2015-09-05 DIAGNOSIS — D72819 Decreased white blood cell count, unspecified: Secondary | ICD-10-CM | POA: Diagnosis not present

## 2015-09-05 DIAGNOSIS — Z79899 Other long term (current) drug therapy: Secondary | ICD-10-CM | POA: Diagnosis not present

## 2015-09-05 DIAGNOSIS — R748 Abnormal levels of other serum enzymes: Secondary | ICD-10-CM | POA: Diagnosis not present

## 2015-09-05 DIAGNOSIS — I10 Essential (primary) hypertension: Secondary | ICD-10-CM | POA: Diagnosis not present

## 2015-09-05 DIAGNOSIS — G35 Multiple sclerosis: Secondary | ICD-10-CM | POA: Diagnosis not present

## 2015-09-09 ENCOUNTER — Other Ambulatory Visit: Payer: Self-pay | Admitting: Psychiatry

## 2015-09-09 DIAGNOSIS — G35 Multiple sclerosis: Secondary | ICD-10-CM

## 2015-09-28 ENCOUNTER — Ambulatory Visit
Admission: RE | Admit: 2015-09-28 | Discharge: 2015-09-28 | Disposition: A | Discharge status: Home / Self Care | Payer: 59 | Source: Ambulatory Visit | Attending: Psychiatry | Admitting: Psychiatry

## 2015-09-28 DIAGNOSIS — G35 Multiple sclerosis: Secondary | ICD-10-CM | POA: Diagnosis not present

## 2015-09-28 MED ORDER — GADOBENATE DIMEGLUMINE 529 MG/ML IV SOLN
18.0000 mL | Freq: Once | INTRAVENOUS | Status: AC | PRN
  Administered 2015-09-28: 18 mL via INTRAVENOUS

## 2015-09-29 ENCOUNTER — Ambulatory Visit (HOSPITAL_COMMUNITY)
Admission: RE | Admit: 2015-09-29 | Discharge: 2015-09-29 | Disposition: A | Discharge status: Home / Self Care | Payer: Medicare Other | Source: Ambulatory Visit | Attending: Psychiatry | Admitting: Psychiatry

## 2015-09-29 DIAGNOSIS — G35 Multiple sclerosis: Secondary | ICD-10-CM | POA: Diagnosis not present

## 2015-09-29 MED ORDER — SODIUM CHLORIDE 0.9 % IV SOLN
300.0000 mg | Freq: Once | INTRAVENOUS | Status: AC
  Administered 2015-09-29: 300 mg via INTRAVENOUS
  Filled 2015-09-29: qty 15

## 2015-09-29 NOTE — Progress Notes (Signed)
PCP: Effie Shy MD  Associated Diagnosis: Multiple Sclerosis Exacerbation  Procedure: IV infusion of Tysabri  Condition during procedure: Patient tolerated infusion well. Patient was observed for 1 hour post transfusion. No adverse symptoms noted. Went over discharge instructions and gave the instructions to patient. Alert, oriented and ambulatory at time of discharge. Discharged to home.

## 2015-10-27 ENCOUNTER — Ambulatory Visit (HOSPITAL_COMMUNITY)
Admission: RE | Admit: 2015-10-27 | Discharge: 2015-10-27 | Disposition: A | Discharge status: Home / Self Care | Payer: Medicare Other | Source: Ambulatory Visit | Attending: Psychiatry | Admitting: Psychiatry

## 2015-10-27 DIAGNOSIS — G35 Multiple sclerosis: Secondary | ICD-10-CM | POA: Diagnosis not present

## 2015-10-27 MED ORDER — SODIUM CHLORIDE 0.9 % IV SOLN
300.0000 mg | Freq: Once | INTRAVENOUS | Status: AC
  Administered 2015-10-27: 300 mg via INTRAVENOUS
  Filled 2015-10-27: qty 15

## 2015-10-27 NOTE — Discharge Instructions (Signed)

## 2015-10-27 NOTE — Progress Notes (Signed)
Medical Provider: Effie Shy  Associated Diagnosis:G35..There were no vitals filed for this visit. Sclerosis  Procedure 300 mg Tysabri IV infused   Patient tolerated infustion well. No adverse reactions. Went over discharge instructions and copy given to patient. She will make her follow up appointment for June.  Discharged to home. Alert, oriented and ambulatory.

## 2015-11-17 ENCOUNTER — Other Ambulatory Visit: Payer: Self-pay | Admitting: Obstetrics & Gynecology

## 2015-11-17 DIAGNOSIS — Z1231 Encounter for screening mammogram for malignant neoplasm of breast: Secondary | ICD-10-CM

## 2015-11-24 ENCOUNTER — Encounter (HOSPITAL_COMMUNITY): Payer: Medicare Other

## 2015-11-25 DIAGNOSIS — G35 Multiple sclerosis: Secondary | ICD-10-CM | POA: Diagnosis not present

## 2015-11-28 ENCOUNTER — Ambulatory Visit
Admission: RE | Admit: 2015-11-28 | Discharge: 2015-11-28 | Disposition: A | Discharge status: Home / Self Care | Payer: 59 | Source: Ambulatory Visit | Attending: Obstetrics & Gynecology | Admitting: Obstetrics & Gynecology

## 2015-11-28 DIAGNOSIS — Z1231 Encounter for screening mammogram for malignant neoplasm of breast: Secondary | ICD-10-CM | POA: Diagnosis not present

## 2015-12-16 DIAGNOSIS — G35 Multiple sclerosis: Secondary | ICD-10-CM | POA: Diagnosis not present

## 2016-02-10 DIAGNOSIS — D72819 Decreased white blood cell count, unspecified: Secondary | ICD-10-CM | POA: Diagnosis not present

## 2016-02-10 DIAGNOSIS — I10 Essential (primary) hypertension: Secondary | ICD-10-CM | POA: Diagnosis not present

## 2016-02-10 DIAGNOSIS — Z23 Encounter for immunization: Secondary | ICD-10-CM | POA: Diagnosis not present

## 2016-02-10 DIAGNOSIS — G35 Multiple sclerosis: Secondary | ICD-10-CM | POA: Diagnosis not present

## 2016-05-02 DIAGNOSIS — G35 Multiple sclerosis: Secondary | ICD-10-CM | POA: Diagnosis not present

## 2016-05-02 DIAGNOSIS — E559 Vitamin D deficiency, unspecified: Secondary | ICD-10-CM | POA: Diagnosis not present

## 2016-05-02 DIAGNOSIS — Z0000 Encounter for general adult medical examination without abnormal findings: Secondary | ICD-10-CM | POA: Diagnosis not present

## 2016-05-02 DIAGNOSIS — I10 Essential (primary) hypertension: Secondary | ICD-10-CM | POA: Diagnosis not present

## 2016-05-02 DIAGNOSIS — E782 Mixed hyperlipidemia: Secondary | ICD-10-CM | POA: Diagnosis not present

## 2016-05-02 DIAGNOSIS — F330 Major depressive disorder, recurrent, mild: Secondary | ICD-10-CM | POA: Diagnosis not present

## 2016-05-02 DIAGNOSIS — Z113 Encounter for screening for infections with a predominantly sexual mode of transmission: Secondary | ICD-10-CM | POA: Diagnosis not present

## 2016-05-02 DIAGNOSIS — D509 Iron deficiency anemia, unspecified: Secondary | ICD-10-CM | POA: Diagnosis not present

## 2016-05-18 DIAGNOSIS — G35 Multiple sclerosis: Secondary | ICD-10-CM | POA: Diagnosis not present

## 2016-05-18 DIAGNOSIS — D72819 Decreased white blood cell count, unspecified: Secondary | ICD-10-CM | POA: Diagnosis not present

## 2016-05-18 DIAGNOSIS — I10 Essential (primary) hypertension: Secondary | ICD-10-CM | POA: Diagnosis not present

## 2016-05-20 DIAGNOSIS — K130 Diseases of lips: Secondary | ICD-10-CM | POA: Diagnosis not present

## 2016-05-20 DIAGNOSIS — T7840XA Allergy, unspecified, initial encounter: Secondary | ICD-10-CM | POA: Diagnosis not present

## 2016-05-23 DIAGNOSIS — E782 Mixed hyperlipidemia: Secondary | ICD-10-CM | POA: Diagnosis not present

## 2016-05-23 DIAGNOSIS — T887XXD Unspecified adverse effect of drug or medicament, subsequent encounter: Secondary | ICD-10-CM | POA: Diagnosis not present

## 2016-06-21 DIAGNOSIS — G35 Multiple sclerosis: Secondary | ICD-10-CM | POA: Diagnosis not present

## 2016-08-17 DIAGNOSIS — I10 Essential (primary) hypertension: Secondary | ICD-10-CM | POA: Diagnosis not present

## 2016-08-17 DIAGNOSIS — G35 Multiple sclerosis: Secondary | ICD-10-CM | POA: Diagnosis not present

## 2016-08-17 DIAGNOSIS — D72819 Decreased white blood cell count, unspecified: Secondary | ICD-10-CM | POA: Diagnosis not present

## 2016-11-30 DIAGNOSIS — E782 Mixed hyperlipidemia: Secondary | ICD-10-CM | POA: Diagnosis not present

## 2016-12-20 DIAGNOSIS — G35 Multiple sclerosis: Secondary | ICD-10-CM | POA: Diagnosis not present

## 2017-01-14 DIAGNOSIS — D3131 Benign neoplasm of right choroid: Secondary | ICD-10-CM | POA: Diagnosis not present

## 2017-01-14 DIAGNOSIS — H40013 Open angle with borderline findings, low risk, bilateral: Secondary | ICD-10-CM | POA: Diagnosis not present

## 2017-01-14 DIAGNOSIS — G35 Multiple sclerosis: Secondary | ICD-10-CM | POA: Diagnosis not present

## 2017-01-30 DIAGNOSIS — G35 Multiple sclerosis: Secondary | ICD-10-CM | POA: Diagnosis not present

## 2017-01-30 DIAGNOSIS — I10 Essential (primary) hypertension: Secondary | ICD-10-CM | POA: Diagnosis not present

## 2017-01-30 DIAGNOSIS — D72819 Decreased white blood cell count, unspecified: Secondary | ICD-10-CM | POA: Diagnosis not present

## 2017-03-04 DIAGNOSIS — G35 Multiple sclerosis: Secondary | ICD-10-CM | POA: Diagnosis not present

## 2017-03-04 DIAGNOSIS — H53461 Homonymous bilateral field defects, right side: Secondary | ICD-10-CM | POA: Diagnosis not present

## 2017-03-04 DIAGNOSIS — H40013 Open angle with borderline findings, low risk, bilateral: Secondary | ICD-10-CM | POA: Diagnosis not present

## 2017-03-04 DIAGNOSIS — D3131 Benign neoplasm of right choroid: Secondary | ICD-10-CM | POA: Diagnosis not present

## 2017-04-11 DIAGNOSIS — Z23 Encounter for immunization: Secondary | ICD-10-CM | POA: Diagnosis not present

## 2017-05-03 DIAGNOSIS — J069 Acute upper respiratory infection, unspecified: Secondary | ICD-10-CM | POA: Diagnosis not present

## 2017-05-03 DIAGNOSIS — I10 Essential (primary) hypertension: Secondary | ICD-10-CM | POA: Diagnosis not present

## 2017-05-10 ENCOUNTER — Other Ambulatory Visit: Payer: Self-pay | Admitting: Psychiatry

## 2017-05-10 DIAGNOSIS — G35 Multiple sclerosis: Secondary | ICD-10-CM

## 2017-06-12 DIAGNOSIS — D3131 Benign neoplasm of right choroid: Secondary | ICD-10-CM | POA: Diagnosis not present

## 2017-06-12 DIAGNOSIS — H40013 Open angle with borderline findings, low risk, bilateral: Secondary | ICD-10-CM | POA: Diagnosis not present

## 2017-06-12 DIAGNOSIS — G35 Multiple sclerosis: Secondary | ICD-10-CM | POA: Diagnosis not present

## 2017-06-12 DIAGNOSIS — H53461 Homonymous bilateral field defects, right side: Secondary | ICD-10-CM | POA: Diagnosis not present

## 2017-06-14 ENCOUNTER — Ambulatory Visit
Admission: RE | Admit: 2017-06-14 | Discharge: 2017-06-14 | Disposition: A | Discharge status: Home / Self Care | Payer: 59 | Source: Ambulatory Visit | Attending: Psychiatry | Admitting: Psychiatry

## 2017-06-14 DIAGNOSIS — G35 Multiple sclerosis: Secondary | ICD-10-CM | POA: Diagnosis not present

## 2017-06-14 MED ORDER — GADOBENATE DIMEGLUMINE 529 MG/ML IV SOLN
17.0000 mL | Freq: Once | INTRAVENOUS | Status: AC | PRN
  Administered 2017-06-14: 17 mL via INTRAVENOUS

## 2017-06-21 DIAGNOSIS — G35 Multiple sclerosis: Secondary | ICD-10-CM | POA: Diagnosis not present

## 2017-07-02 DIAGNOSIS — G35 Multiple sclerosis: Secondary | ICD-10-CM | POA: Diagnosis not present

## 2017-07-02 DIAGNOSIS — I10 Essential (primary) hypertension: Secondary | ICD-10-CM | POA: Diagnosis not present

## 2017-07-02 DIAGNOSIS — D72819 Decreased white blood cell count, unspecified: Secondary | ICD-10-CM | POA: Diagnosis not present

## 2017-07-15 DIAGNOSIS — I10 Essential (primary) hypertension: Secondary | ICD-10-CM | POA: Diagnosis not present

## 2017-07-15 DIAGNOSIS — D509 Iron deficiency anemia, unspecified: Secondary | ICD-10-CM | POA: Diagnosis not present

## 2017-07-15 DIAGNOSIS — E559 Vitamin D deficiency, unspecified: Secondary | ICD-10-CM | POA: Diagnosis not present

## 2017-07-15 DIAGNOSIS — Z0000 Encounter for general adult medical examination without abnormal findings: Secondary | ICD-10-CM | POA: Diagnosis not present

## 2017-07-15 DIAGNOSIS — E782 Mixed hyperlipidemia: Secondary | ICD-10-CM | POA: Diagnosis not present

## 2017-07-15 DIAGNOSIS — Z23 Encounter for immunization: Secondary | ICD-10-CM | POA: Diagnosis not present

## 2017-07-19 DIAGNOSIS — I10 Essential (primary) hypertension: Secondary | ICD-10-CM | POA: Diagnosis not present

## 2017-07-19 DIAGNOSIS — E782 Mixed hyperlipidemia: Secondary | ICD-10-CM | POA: Diagnosis not present

## 2017-07-19 DIAGNOSIS — E559 Vitamin D deficiency, unspecified: Secondary | ICD-10-CM | POA: Diagnosis not present

## 2017-07-19 DIAGNOSIS — D509 Iron deficiency anemia, unspecified: Secondary | ICD-10-CM | POA: Diagnosis not present

## 2017-07-19 DIAGNOSIS — Z1231 Encounter for screening mammogram for malignant neoplasm of breast: Secondary | ICD-10-CM | POA: Diagnosis not present

## 2017-09-11 DIAGNOSIS — L720 Epidermal cyst: Secondary | ICD-10-CM | POA: Diagnosis not present

## 2017-09-11 DIAGNOSIS — L723 Sebaceous cyst: Secondary | ICD-10-CM | POA: Diagnosis not present

## 2017-10-02 DIAGNOSIS — G43709 Chronic migraine without aura, not intractable, without status migrainosus: Secondary | ICD-10-CM | POA: Diagnosis not present

## 2017-10-02 DIAGNOSIS — G35 Multiple sclerosis: Secondary | ICD-10-CM | POA: Diagnosis not present

## 2017-10-02 DIAGNOSIS — D72819 Decreased white blood cell count, unspecified: Secondary | ICD-10-CM | POA: Diagnosis not present

## 2017-10-02 DIAGNOSIS — I10 Essential (primary) hypertension: Secondary | ICD-10-CM | POA: Diagnosis not present

## 2017-12-16 DIAGNOSIS — H53461 Homonymous bilateral field defects, right side: Secondary | ICD-10-CM | POA: Diagnosis not present

## 2017-12-16 DIAGNOSIS — D3131 Benign neoplasm of right choroid: Secondary | ICD-10-CM | POA: Diagnosis not present

## 2017-12-16 DIAGNOSIS — H40013 Open angle with borderline findings, low risk, bilateral: Secondary | ICD-10-CM | POA: Diagnosis not present

## 2017-12-16 DIAGNOSIS — G35 Multiple sclerosis: Secondary | ICD-10-CM | POA: Diagnosis not present

## 2017-12-23 DIAGNOSIS — G35 Multiple sclerosis: Secondary | ICD-10-CM | POA: Diagnosis not present

## 2018-04-03 DIAGNOSIS — G43709 Chronic migraine without aura, not intractable, without status migrainosus: Secondary | ICD-10-CM | POA: Diagnosis not present

## 2018-04-03 DIAGNOSIS — D72819 Decreased white blood cell count, unspecified: Secondary | ICD-10-CM | POA: Diagnosis not present

## 2018-04-03 DIAGNOSIS — I10 Essential (primary) hypertension: Secondary | ICD-10-CM | POA: Diagnosis not present

## 2018-04-03 DIAGNOSIS — G35 Multiple sclerosis: Secondary | ICD-10-CM | POA: Diagnosis not present

## 2018-04-03 DIAGNOSIS — F988 Other specified behavioral and emotional disorders with onset usually occurring in childhood and adolescence: Secondary | ICD-10-CM | POA: Diagnosis not present

## 2018-07-03 ENCOUNTER — Encounter (HOSPITAL_COMMUNITY): Admission: RE | Admit: 2018-07-03 | Payer: 59 | Source: Ambulatory Visit

## 2018-07-23 ENCOUNTER — Encounter (HOSPITAL_COMMUNITY)
Admission: RE | Admit: 2018-07-23 | Discharge: 2018-07-23 | Disposition: A | Discharge status: Home / Self Care | Payer: Medicare Other | Source: Ambulatory Visit | Attending: Psychiatry | Admitting: Psychiatry

## 2018-07-23 ENCOUNTER — Encounter (HOSPITAL_COMMUNITY): Payer: Self-pay

## 2018-07-23 DIAGNOSIS — G35 Multiple sclerosis: Secondary | ICD-10-CM | POA: Diagnosis present

## 2018-07-23 HISTORY — DX: Multiple sclerosis, unspecified: G35.D

## 2018-07-23 HISTORY — DX: Pure hypercholesterolemia, unspecified: E78.00

## 2018-07-23 HISTORY — DX: Multiple sclerosis: G35

## 2018-07-23 MED ORDER — METHYLPREDNISOLONE SODIUM SUCC 125 MG IJ SOLR
125.0000 mg | Freq: Once | INTRAMUSCULAR | Status: AC
Start: 2018-07-23 — End: 2018-07-23
  Administered 2018-07-23: 125 mg via INTRAVENOUS
  Filled 2018-07-23: qty 2

## 2018-07-23 MED ORDER — DIPHENHYDRAMINE HCL 50 MG/ML IJ SOLN
25.0000 mg | Freq: Once | INTRAMUSCULAR | Status: AC
  Administered 2018-07-23: 25 mg via INTRAVENOUS
  Filled 2018-07-23: qty 1

## 2018-07-23 MED ORDER — SODIUM CHLORIDE 0.9 % IV SOLN
600.0000 mg | INTRAVENOUS | Status: DC
  Administered 2018-07-23: 600 mg via INTRAVENOUS
  Filled 2018-07-23: qty 20

## 2018-07-23 MED ORDER — SODIUM CHLORIDE 0.9 % IV SOLN
Freq: Once | INTRAVENOUS | Status: AC
  Administered 2018-07-23: 08:00:00 via INTRAVENOUS

## 2018-07-23 MED ORDER — ACETAMINOPHEN 500 MG PO TABS
1000.0000 mg | ORAL_TABLET | Freq: Once | ORAL | Status: AC
  Administered 2018-07-23: 1000 mg via ORAL
  Filled 2018-07-23: qty 2

## 2019-01-22 ENCOUNTER — Encounter (HOSPITAL_COMMUNITY): Payer: Self-pay

## 2019-01-22 ENCOUNTER — Ambulatory Visit (HOSPITAL_COMMUNITY)
Admission: RE | Admit: 2019-01-22 | Discharge: 2019-01-22 | Disposition: A | Discharge status: Home / Self Care | Payer: Medicare Other | Source: Ambulatory Visit | Attending: Psychiatry | Admitting: Psychiatry

## 2019-01-22 ENCOUNTER — Other Ambulatory Visit: Payer: Self-pay

## 2019-01-22 DIAGNOSIS — G35 Multiple sclerosis: Secondary | ICD-10-CM | POA: Insufficient documentation

## 2019-01-22 MED ORDER — METHYLPREDNISOLONE SODIUM SUCC 125 MG IJ SOLR
125.0000 mg | Freq: Once | INTRAMUSCULAR | Status: AC
  Administered 2019-01-22: 09:00:00 125 mg via INTRAVENOUS

## 2019-01-22 MED ORDER — SODIUM CHLORIDE 0.9 % IV SOLN
600.0000 mg | Freq: Once | INTRAVENOUS | Status: AC
  Administered 2019-01-22: 600 mg via INTRAVENOUS
  Filled 2019-01-22: qty 20

## 2019-01-22 MED ORDER — DIPHENHYDRAMINE HCL 50 MG/ML IJ SOLN
INTRAMUSCULAR | Status: AC
  Administered 2019-01-22: 25 mg via INTRAVENOUS
  Filled 2019-01-22: qty 1

## 2019-01-22 MED ORDER — ACETAMINOPHEN 500 MG PO TABS
ORAL_TABLET | ORAL | Status: AC
  Administered 2019-01-22: 1000 mg via ORAL
  Filled 2019-01-22: qty 2

## 2019-01-22 MED ORDER — METHYLPREDNISOLONE SODIUM SUCC 125 MG IJ SOLR
INTRAMUSCULAR | Status: AC
  Administered 2019-01-22: 125 mg via INTRAVENOUS
  Filled 2019-01-22: qty 2

## 2019-01-22 MED ORDER — DIPHENHYDRAMINE HCL 50 MG/ML IJ SOLN
25.0000 mg | Freq: Once | INTRAMUSCULAR | Status: AC
  Administered 2019-01-22: 09:00:00 25 mg via INTRAVENOUS

## 2019-01-22 MED ORDER — SODIUM CHLORIDE 0.9 % IV SOLN
Freq: Once | INTRAVENOUS | Status: AC
  Administered 2019-01-22: 09:00:00 via INTRAVENOUS

## 2019-01-22 MED ORDER — ACETAMINOPHEN 500 MG PO TABS
1000.0000 mg | ORAL_TABLET | Freq: Once | ORAL | Status: AC
  Administered 2019-01-22: 09:00:00 1000 mg via ORAL

## 2019-01-23 NOTE — Progress Notes (Signed)
Explained to pt the next appointment in February 2021 will be at a new location, the [facility].  Gave the pt the address, phone number and instructions on how to get there and where it was in the building.  Pt voiced understanding and states she is familiar with the place.

## 2019-02-05 IMAGING — MR MR HEAD WO/W CM
13 series · 48 of 48 positions shown · IV contrast (multihance)
Comparison: MRI head 09/28/2015

CLINICAL DATA: Multiple sclerosis

Creatinine was obtained on site at [HOSPITAL] at [HOSPITAL].
Results: Creatinine 0.8 mg/dL.
EXAM:
MRI HEAD WITHOUT AND WITH CONTRAST
TECHNIQUE: Multiplanar, multiecho pulse sequences of the brain and surrounding
structures were obtained without and with intravenous contrast.
CONTRAST:  17 mL MultiHance IV

[Series 2: T1 · sagittal · 5.0mm · 0.45mm/px · 1 of 21 slices shown]
[im 1/21]
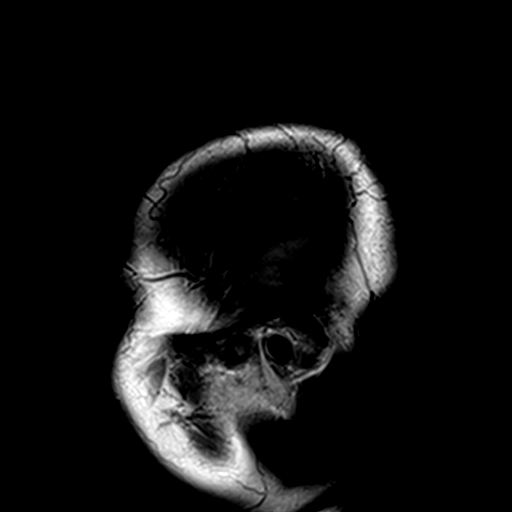

[Series 3: DWI · axial · 3.0mm · 1.80mm/px · z∈[-72,+75]mm · 6 of 100 slices shown (1 of 4)]
[im 1/100]
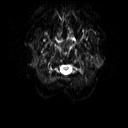
[im 20/100]
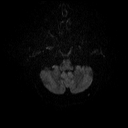
[im 40/100]
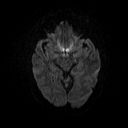
[im 60/100]
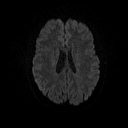
[im 80/100]
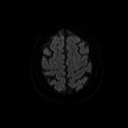
[im 100/100]
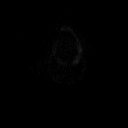

[Series 4: DWI · axial · 3.0mm · 1.80mm/px · z∈[-72,+75]mm · 3 of 48 slices shown (2 of 4)]
[im 1/48]
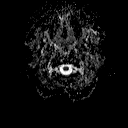
[im 24/48]
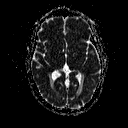
[im 48/48]
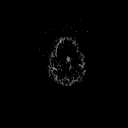

[Series 5: DWI · coronal · 5.0mm · 1.80mm/px · 4 of 63 slices shown (3 of 4)]
[im 1/63]
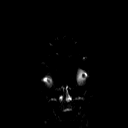
[im 21/63]
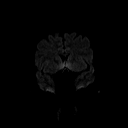
[im 42/63]
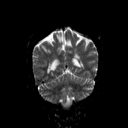
[im 63/63]
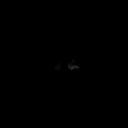

[Series 6: DWI · coronal · 5.0mm · 1.80mm/px · 2 of 34 slices shown (4 of 4)]
[im 1/34]
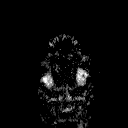
[im 34/34]
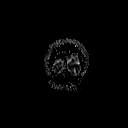

[Series 7: T2 · axial · 5.0mm · 0.51mm/px · z∈[-83,+72]mm · 2 of 24 slices shown (1 of 2)]
[im 1/24]
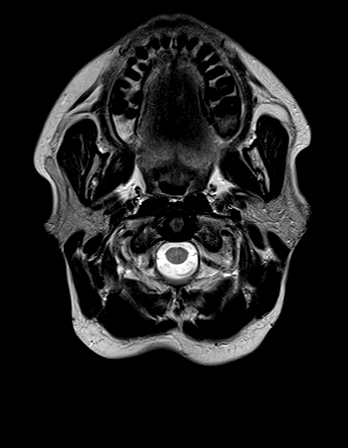
[im 24/24]
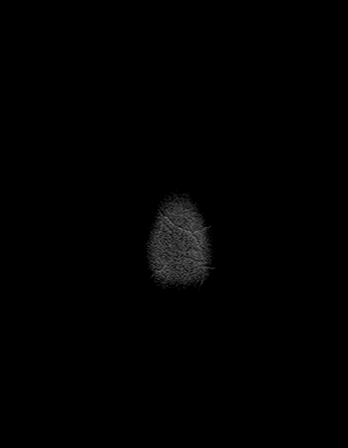

[Series 8: FLAIR · axial · 3.0mm · 0.45mm/px · z∈[-77,+66]mm · 2 of 32 slices shown (1 of 2)]
[im 1/32]
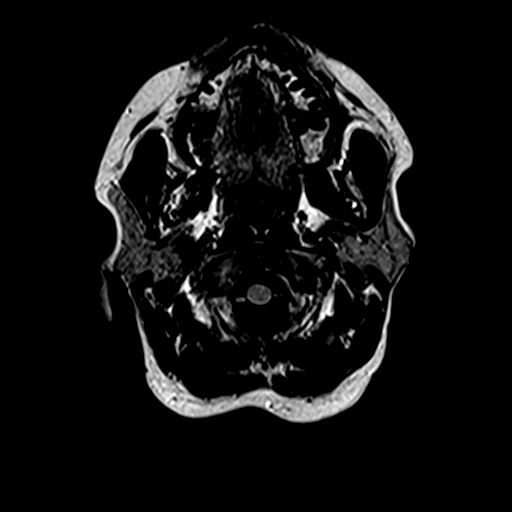
[im 32/32]
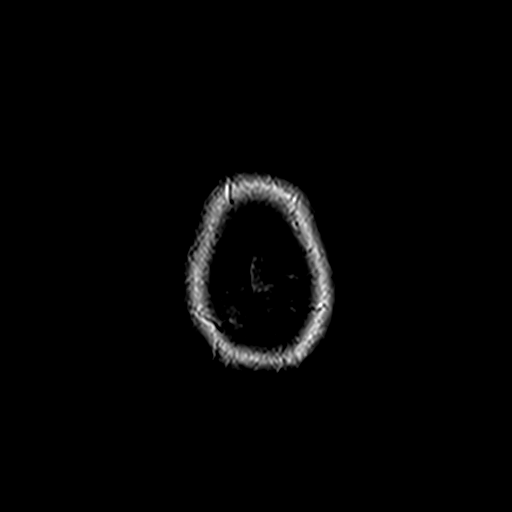

[Series 10: swi_images · axial · 5.0mm · 0.90mm/px · z∈[-77,+67]mm · 2 of 30 slices shown]
[im 1/30]
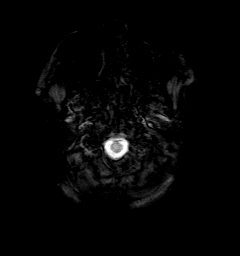
[im 30/30]
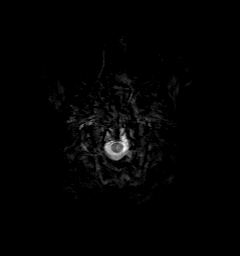

[Series 11: t1_mpr_tra · axial · 1.0mm · 0.75mm/px · z∈[-80,+68]mm · 10 of 144 slices shown (1 of 2)]
[im 1/144]
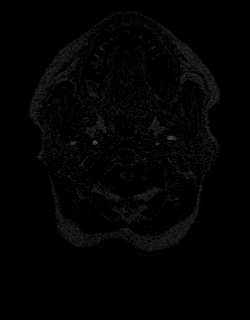
[im 16/144]
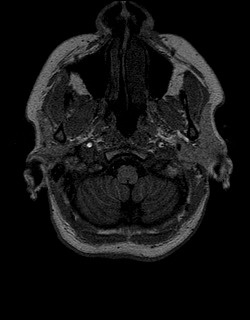
[im 32/144]
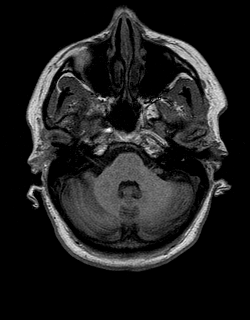
[im 48/144]
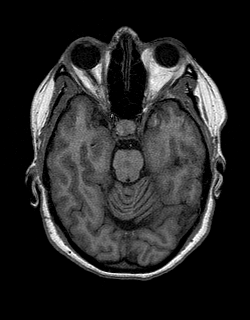
[im 64/144]
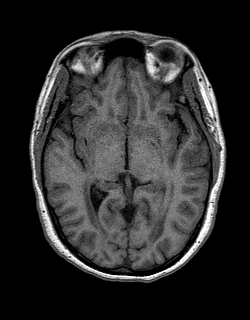
[im 80/144]
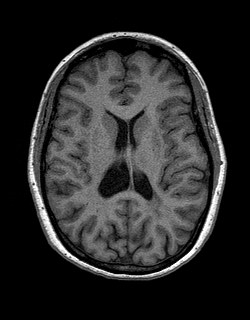
[im 96/144]
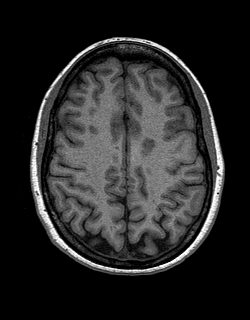
[im 112/144]
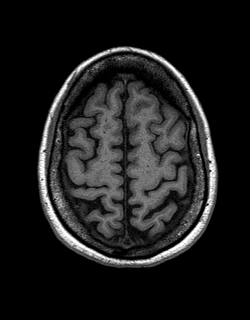
[im 128/144]
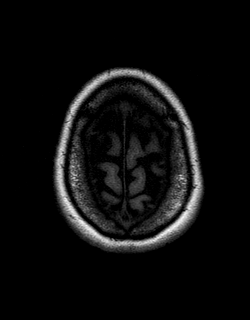
[im 144/144]
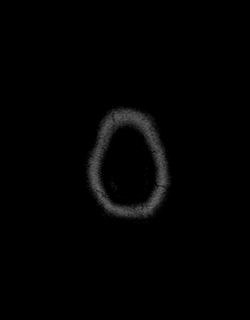

[Series 12: FLAIR · sagittal · 5.0mm · 0.45mm/px · 2 of 25 slices shown (2 of 2)]
[im 1/25]
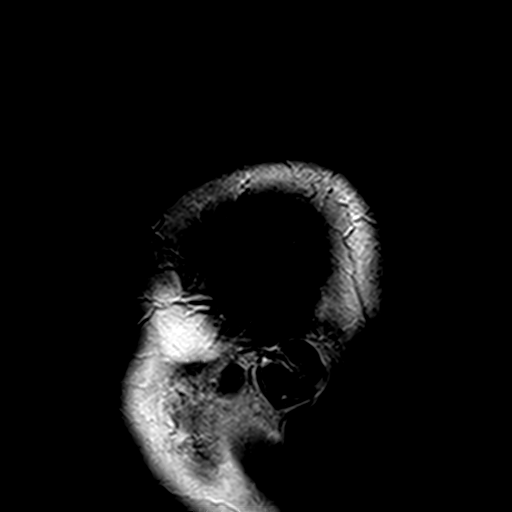
[im 25/25]
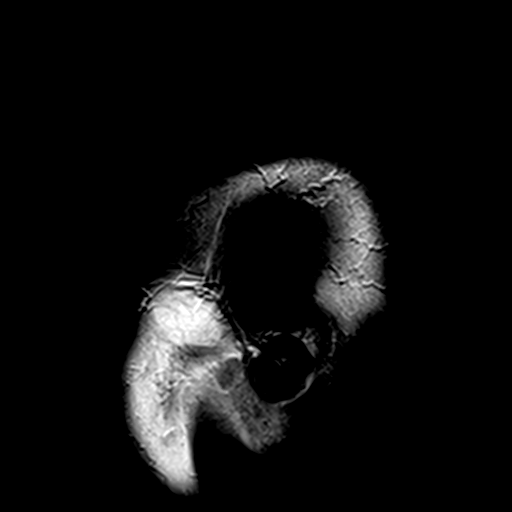

[Series 13: T2 · coronal · 5.0mm · 0.45mm/px · 2 of 27 slices shown (2 of 2)]
[im 1/27]
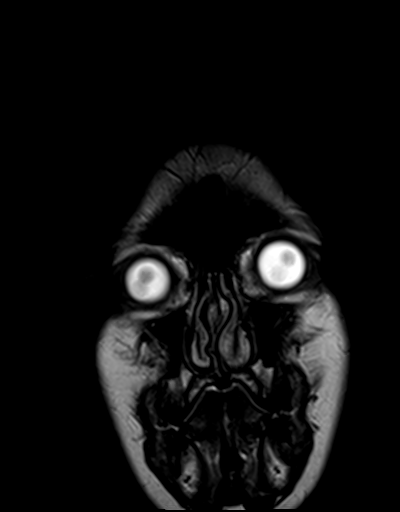
[im 27/27]
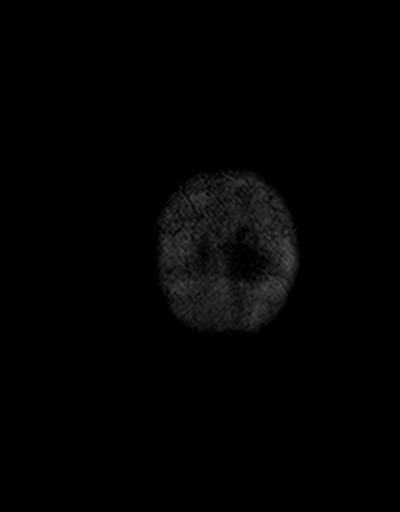

[Series 14: t1_mpr_tra · axial · 1.0mm · 0.75mm/px · z∈[-80,+68]mm · 10 of 144 slices shown (2 of 2)]
[im 1/144]
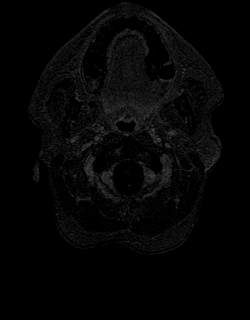
[im 16/144]
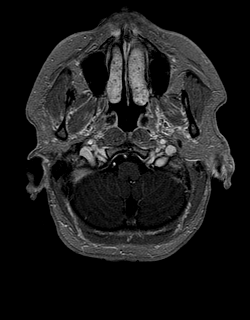
[im 32/144]
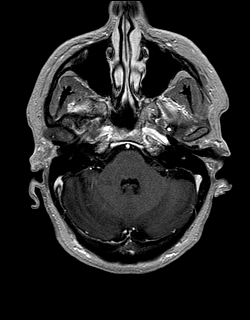
[im 48/144]
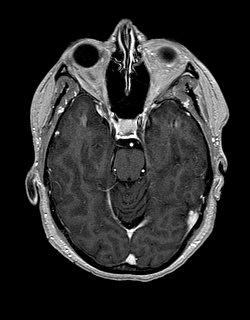
[im 64/144]
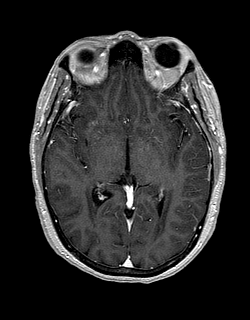
[im 80/144]
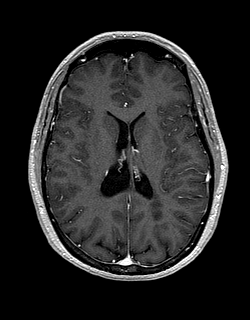
[im 96/144]
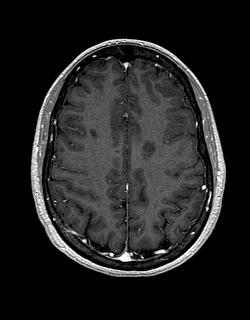
[im 112/144]
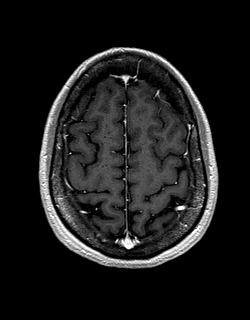
[im 128/144]
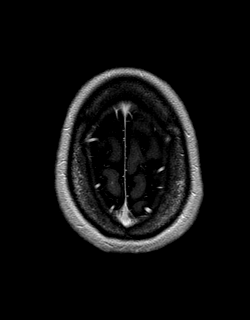
[im 144/144]
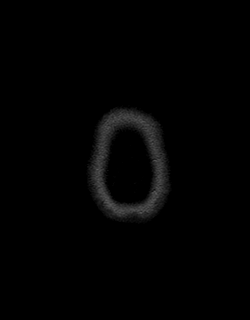

[Series 15: post cor · coronal · 5.0mm · 0.45mm/px · 2 of 27 slices shown]
[im 1/27]
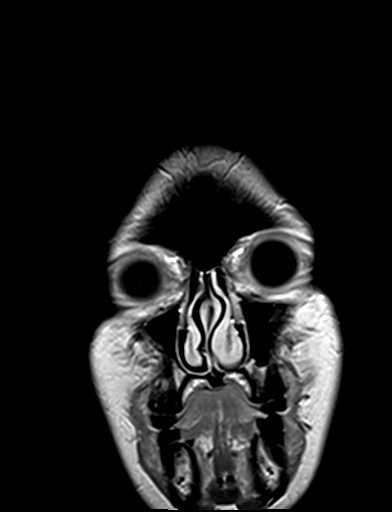
[im 27/27]
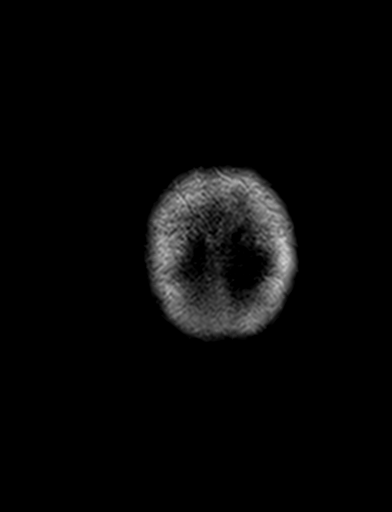

[48 of 48 positions shown; findings below may reference images not displayed]

FINDINGS: Brain: Periventricular and deep white matter lesions bilaterally
stable. Hyperintense lesions in the right brachium pontis unchanged.

Negative for acute infarct. No lesion show restricted diffusion or
abnormal enhancement. Negative for hemorrhage or mass.

Vascular: Normal arterial flow voids.

Skull and upper cervical spine: Negative

Sinuses/Orbits: Negative

Other: None
IMPRESSION: Changes consistent with multiple sclerosis, stable from the prior
study. No new findings.

## 2019-07-03 ENCOUNTER — Ambulatory Visit: Payer: Medicare Other | Attending: Internal Medicine

## 2019-07-03 DIAGNOSIS — Z20822 Contact with and (suspected) exposure to covid-19: Secondary | ICD-10-CM

## 2019-07-04 LAB — NOVEL CORONAVIRUS, NAA: SARS-CoV-2, NAA: DETECTED — AB

## 2019-07-08 ENCOUNTER — Ambulatory Visit (HOSPITAL_COMMUNITY)
Admission: RE | Admit: 2019-07-08 | Discharge: 2019-07-08 | Disposition: A | Discharge status: Home / Self Care | Payer: Medicare Other | Source: Ambulatory Visit | Attending: Pulmonary Disease | Admitting: Pulmonary Disease

## 2019-07-08 ENCOUNTER — Other Ambulatory Visit: Payer: Self-pay | Admitting: Physician Assistant

## 2019-07-08 DIAGNOSIS — U071 COVID-19: Secondary | ICD-10-CM

## 2019-07-08 DIAGNOSIS — Z23 Encounter for immunization: Secondary | ICD-10-CM | POA: Insufficient documentation

## 2019-07-08 DIAGNOSIS — G35D Multiple sclerosis, unspecified: Secondary | ICD-10-CM

## 2019-07-08 DIAGNOSIS — G35 Multiple sclerosis: Secondary | ICD-10-CM

## 2019-07-08 DIAGNOSIS — I10 Essential (primary) hypertension: Secondary | ICD-10-CM

## 2019-07-08 MED ORDER — ALBUTEROL SULFATE HFA 108 (90 BASE) MCG/ACT IN AERS: 2.0000 | INHALATION_SPRAY | Freq: Once | RESPIRATORY_TRACT | Status: DC | PRN

## 2019-07-08 MED ORDER — METHYLPREDNISOLONE SODIUM SUCC 125 MG IJ SOLR: 125.0000 mg | Freq: Once | INTRAMUSCULAR | Status: DC | PRN

## 2019-07-08 MED ORDER — SODIUM CHLORIDE 0.9 % IV SOLN: INTRAVENOUS | Status: DC | PRN

## 2019-07-08 MED ORDER — SODIUM CHLORIDE 0.9 % IV SOLN
700.0000 mg | Freq: Once | INTRAVENOUS | Status: AC
  Administered 2019-07-08: 700 mg via INTRAVENOUS
  Filled 2019-07-08: qty 20

## 2019-07-08 MED ORDER — EPINEPHRINE 0.3 MG/0.3ML IJ SOAJ: 0.3000 mg | Freq: Once | INTRAMUSCULAR | Status: DC | PRN

## 2019-07-08 MED ORDER — FAMOTIDINE IN NACL 20-0.9 MG/50ML-% IV SOLN: 20.0000 mg | Freq: Once | INTRAVENOUS | Status: DC | PRN

## 2019-07-08 MED ORDER — DIPHENHYDRAMINE HCL 50 MG/ML IJ SOLN: 50.0000 mg | Freq: Once | INTRAMUSCULAR | Status: DC | PRN

## 2019-07-08 NOTE — Progress Notes (Signed)
  Diagnosis: COVID-19  Physician: Dr. Joya Gaskins  Procedure: [facility] Med: bamlanivimab infusion - Provided patient with bamlanimivab fact sheet for patients, parents and caregivers prior to infusion.  Complications: No immediate complications noted.  Discharge: Discharged home   Janine Ores 07/08/2019

## 2019-07-08 NOTE — Discharge Instructions (Signed)

## 2019-07-08 NOTE — Progress Notes (Signed)
  I connected by phone with [name] on 07/08/2019 at 2:41 PM to discuss the potential use of an new treatment for mild to moderate COVID-19 viral infection in non-hospitalized patients.  This patient is a 49 y.o. female that meets the FDA criteria for Emergency Use Authorization of bamlanivimab or casirivimab\imdevimab.  Has a (+) direct SARS-CoV-2 viral test result  Has mild or moderate COVID-19   Is ? 49 years of age and weighs ? 40 kg  Is NOT hospitalized due to COVID-19  Is NOT requiring oxygen therapy or requiring an increase in baseline oxygen flow rate due to COVID-19  Is within 10 days of symptom onset  Has at least one of the high risk factor(s) for progression to severe COVID-19 and/or hospitalization as defined in EUA.  Specific high risk criteria : Immunosuppressive Disease   I have spoken and communicated the following to the patient or parent/caregiver:  1. FDA has authorized the emergency use of bamlanivimab and casirivimab\imdevimab for the treatment of mild to moderate COVID-19 in adults and pediatric patients with positive results of direct SARS-CoV-2 viral testing who are 50 years of age and older weighing at least 40 kg, and who are at high risk for progressing to severe COVID-19 and/or hospitalization.  2. The significant known and potential risks and benefits of bamlanivimab and casirivimab\imdevimab, and the extent to which such potential risks and benefits are unknown.  3. Information on available alternative treatments and the risks and benefits of those alternatives, including clinical trials.  4. Patients treated with bamlanivimab and casirivimab\imdevimab should continue to self-isolate and use infection control measures (e.g., wear mask, isolate, social distance, avoid sharing personal items, clean and disinfect "high touch" surfaces, and frequent handwashing) according to CDC guidelines.   5. The patient or parent/caregiver has the option to accept or  refuse bamlanivimab or casirivimab\imdevimab .  After reviewing this information with the patient, The patient agreed to proceed with receiving the bamlanimivab infusion and will be provided a copy of the Fact sheet prior to receiving the infusion.[name] 07/08/2019 2:41 PM

## 2019-07-28 ENCOUNTER — Inpatient Hospital Stay (HOSPITAL_COMMUNITY)
Admission: RE | Admit: 2019-07-28 | Discharge: 2019-07-28 | Disposition: A | Discharge status: Home / Self Care | Payer: Medicare Other | Source: Ambulatory Visit

## 2019-09-09 ENCOUNTER — Other Ambulatory Visit: Payer: Self-pay

## 2019-09-09 ENCOUNTER — Ambulatory Visit (HOSPITAL_COMMUNITY)
Admission: RE | Admit: 2019-09-09 | Discharge: 2019-09-09 | Disposition: A | Discharge status: Home / Self Care | Payer: Medicare Other | Source: Ambulatory Visit | Attending: Internal Medicine | Admitting: Internal Medicine

## 2019-09-09 DIAGNOSIS — G35 Multiple sclerosis: Secondary | ICD-10-CM | POA: Insufficient documentation

## 2019-09-09 MED ORDER — METHYLPREDNISOLONE SODIUM SUCC 125 MG IJ SOLR
100.0000 mg | Freq: Once | INTRAMUSCULAR | Status: AC
  Administered 2019-09-09: 09:00:00 100 mg via INTRAVENOUS
  Filled 2019-09-09: qty 2

## 2019-09-09 MED ORDER — SODIUM CHLORIDE 0.9 % IV SOLN: INTRAVENOUS | Status: DC

## 2019-09-09 MED ORDER — SODIUM CHLORIDE 0.9 % IV SOLN
600.0000 mg | Freq: Once | INTRAVENOUS | Status: AC
  Administered 2019-09-09: 600 mg via INTRAVENOUS
  Filled 2019-09-09: qty 20

## 2019-09-09 MED ORDER — DIPHENHYDRAMINE HCL 50 MG/ML IJ SOLN
25.0000 mg | INTRAMUSCULAR | Status: DC | PRN
  Administered 2019-09-09: 09:00:00 25 mg via INTRAVENOUS
  Filled 2019-09-09: qty 1

## 2019-09-09 MED ORDER — ACETAMINOPHEN 325 MG PO TABS
650.0000 mg | ORAL_TABLET | Freq: Once | ORAL | Status: AC
  Administered 2019-09-09: 09:00:00 650 mg via ORAL
  Filled 2019-09-09: qty 2

## 2019-09-09 NOTE — Discharge Instructions (Signed)
Ocrelizumab injection °What is this medicine? °OCRELIZUMAB (ok re LIZ ue mab) treats multiple sclerosis. It helps to decrease the number of multiple sclerosis relapses. It is not a cure. °This medicine may be used for other purposes; ask your health care provider or pharmacist if you have questions. °COMMON BRAND NAME(S): OCREVUS °What should I tell my health care provider before I take this medicine? °They need to know if you have any of these conditions: °· cancer °· hepatitis B infection °· other infection (especially a virus infection such as chickenpox, cold sores, or herpes) °· an unusual or allergic reaction to ocrelizumab, other medicines, foods, dyes or preservatives °· pregnant or trying to get pregnant °· breast-feeding °How should I use this medicine? °This medicine is for infusion into a vein. It is given by a health care professional in a hospital or clinic setting. °A special MedGuide will be given to you before each treatment. Be sure to read this information carefully each time. °Talk to your pediatrician regarding the use of this medicine in children. Special care may be needed. °Overdosage: If you think you have taken too much of this medicine contact a poison control center or emergency room at once. °NOTE: This medicine is only for you. Do not share this medicine with others. °What if I miss a dose? °Keep appointments for follow-up doses as directed. It is important not to miss your dose. Call your doctor or health care professional if you are unable to keep an appointment. °What may interact with this medicine? °· alemtuzumab °· daclizumab °· dimethyl fumarate °· fingolimod °· glatiramer °· interferon beta °· live virus vaccines °· mitoxantrone °· natalizumab °· peginterferon beta °· rituximab °· steroid medicines like prednisone or cortisone °· teriflunomide °This list may not describe all possible interactions. Give your health care provider a list of all the medicines, herbs,  non-prescription drugs, or dietary supplements you use. Also tell them if you smoke, drink alcohol, or use illegal drugs. Some items may interact with your medicine. °What should I watch for while using this medicine? °Tell your doctor or healthcare professional if your symptoms do not start to get better or if they get worse. °This medicine can cause serious allergic reactions. To reduce your risk you may need to take medicine before treatment with this medicine. Take your medicine as directed. °Women should inform their doctor if they wish to become pregnant or think they might be pregnant. There is a potential for serious side effects to an unborn child. Talk to your health care professional or pharmacist for more information. Female patients should use effective birth control methods while receiving this medicine and for 6 months after the last dose. °Call your doctor or health care professional for advice if you get a fever, chills or sore throat, or other symptoms of a cold or flu. Do not treat yourself. This drug decreases your body's ability to fight infections. Try to avoid being around people who are sick. °If you have a hepatitis B infection or a history of a hepatitis B infection, talk to your doctor. The symptoms of hepatitis B may get worse if you take this medicine. °In some patients, this medicine may cause a serious brain infection that may cause death. If you have any problems seeing, thinking, speaking, walking, or standing, tell your doctor right away. If you cannot reach your doctor, urgently seek other source of medical care. °This medicine can decrease the response to a vaccine. If you need to get   vaccinated, tell your healthcare professional if you have received this medicine. Extra booster doses may be needed. Talk to your doctor to see if a different vaccination schedule is needed. °Talk to your doctor about your risk of cancer. You may be more at risk for certain types of cancers if you  take this medicine. °What side effects may I notice from receiving this medicine? °Side effects that you should report to your doctor or health care professional as soon as possible: °· allergic reactions like skin rash, itching or hives, swelling of the face, lips, or tongue °· breathing problems °· facial flushing °· fast, irregular heartbeat °· lump or soreness in the breast °· signs and symptoms of herpes such as cold sore, shingles, or genital sores °· signs and symptoms of infection like fever or chills, cough, sore throat, pain or trouble passing urine °· signs and symptoms of low blood pressure like dizziness; feeling faint or lightheaded, falls; unusually weak or tired °· signs and symptoms of progressive multifocal leukoencephalopathy (PML) like changes in vision; clumsiness; confusion; personality changes; weakness on one side of the body °· swelling of the ankles, feet, hands °Side effects that usually do not require medical attention (report these to your doctor or health care professional if they continue or are bothersome): °· back pain °· depressed mood °· diarrhea °· pain, redness, or irritation at site where injected °This list may not describe all possible side effects. Call your doctor for medical advice about side effects. You may report side effects to FDA at 1-800-FDA-1088. °Where should I keep my medicine? °This drug is given in a hospital or clinic and will not be stored at home. °NOTE: This sheet is a summary. It may not cover all possible information. If you have questions about this medicine, talk to your doctor, pharmacist, or health care provider. °© 2020 [employer] (2018-05-26 07:41:53) ° °

## 2019-09-09 NOTE — Progress Notes (Addendum)
0830 Pt arrived at the [facility] for an Ocrevus infusion. Per pt's faxed orders patient was suppose to have a negative covid test before receiving infusion. RN called Dr. Threasa Beards office at [phone] for order clarification. RN spoke to nurse Velna Hatchet who stated per Dr. Jacqulynn Cadet pt was OK to received infusion without the covid test.   0840 Pt brought back to recliner chair A&Ox4, ambulatory. VSS, RN attempted IV to R FA, with positive blood return. Awaiting meds to be verified by pharmacy.   0900 RN medicated per MAR with IV benadryl, solu-medrol, and PO tylenol. After meds flushed through IV irritation and redness occurred on patient's R FA. RN flushed with NS and blood return again noted but pt c/o it was painful and swelling. RN attempted 2nd IV on L FA but unsuccessful. IV team consult placed. RN then attempted IV in R AC and successful. NS started at [facility]. Awaiting meds from the pharmacy. Pt resting in recliner, WCTM.    0945 Ocrevus infusion started at 45ml/hr. Pt tolerating well, WCTM.   1015 Rate increased, pt tolerating well, WCTM.   1045 Rate increased, pt tolerating well, WCTM.    1115 Rate increased, pt tolerating well, WCTM.  1200 Pt sleeping in recliner, easy to arouse, no adverse reactions noted, WCTM.   1300 Pt playing on phone, snack and drink provided, tolerating infusion, no other needs at this time, WCTM.   1330 IV med done infusing. RN educated about staying 1 hour for monitoring for adverse reactions. Pt stated she had received med before with no complications. VSS, IV removed without difficulty. WCTM.   1400 Pt discharged home. RN reviewed discharge information with patient, pt understands without assistance. Pt A&Ox4, ambulatory on discharge from the [facility].

## 2020-03-10 ENCOUNTER — Other Ambulatory Visit: Payer: Self-pay

## 2020-03-10 ENCOUNTER — Ambulatory Visit (HOSPITAL_COMMUNITY)
Admission: RE | Admit: 2020-03-10 | Discharge: 2020-03-10 | Disposition: A | Discharge status: Home / Self Care | Payer: Medicare Other | Source: Ambulatory Visit | Attending: Internal Medicine | Admitting: Internal Medicine

## 2020-03-10 DIAGNOSIS — G35 Multiple sclerosis: Secondary | ICD-10-CM | POA: Insufficient documentation

## 2020-03-10 MED ORDER — SODIUM CHLORIDE 0.9 % IV SOLN
600.0000 mg | Freq: Once | INTRAVENOUS | Status: AC
  Administered 2020-03-10: 600 mg via INTRAVENOUS
  Filled 2020-03-10: qty 20

## 2020-03-10 MED ORDER — ACETAMINOPHEN 500 MG PO TABS
1000.0000 mg | ORAL_TABLET | Freq: Once | ORAL | Status: AC
  Administered 2020-03-10: 1000 mg via ORAL
  Filled 2020-03-10: qty 2

## 2020-03-10 MED ORDER — METHYLPREDNISOLONE SODIUM SUCC 125 MG IJ SOLR
125.0000 mg | Freq: Once | INTRAMUSCULAR | Status: AC
  Administered 2020-03-10: 125 mg via INTRAVENOUS
  Filled 2020-03-10: qty 2

## 2020-03-10 MED ORDER — DIPHENHYDRAMINE HCL 50 MG/ML IJ SOLN
25.0000 mg | Freq: Once | INTRAMUSCULAR | Status: AC
  Administered 2020-03-10: 25 mg via INTRAVENOUS
  Filled 2020-03-10: qty 1

## 2020-03-10 MED ORDER — SODIUM CHLORIDE 0.9 % IV SOLN
INTRAVENOUS | Status: DC | PRN
  Administered 2020-03-10: 250 mL via INTRAVENOUS

## 2020-03-10 NOTE — Progress Notes (Signed)
PATIENT [facility]ARE CENTER NOTE  Diagnosis: Relapse Remitting Multiple Sclerosis    Provider: Effie Shy, MD   Procedure: Ocrevus 600 mg infusion    Note: Patient received Ocrevus infusion via PIV. Pre-medications given per order (Solumedrol, Benadryl and Tylenol). Infusion titrated per protocol. Patient tolerated infusion well with no adverse reaction. Vital signs stable. Observed patient for 30 minutee post-infusion. Patient declined to wait for 1 hour. Discharge instructions given. Patient is to come back in 6 months for next infusion. Patient alert, oriented and ambulatory at discharge.

## 2020-03-10 NOTE — Discharge Instructions (Signed)
Ocrelizumab injection °What is this medicine? °OCRELIZUMAB (ok re LIZ ue mab) treats multiple sclerosis. It helps to decrease the number of multiple sclerosis relapses. It is not a cure. °This medicine may be used for other purposes; ask your health care provider or pharmacist if you have questions. °COMMON BRAND NAME(S): OCREVUS °What should I tell my health care provider before I take this medicine? °They need to know if you have any of these conditions: °· cancer °· hepatitis B infection °· other infection (especially a virus infection such as chickenpox, cold sores, or herpes) °· an unusual or allergic reaction to ocrelizumab, other medicines, foods, dyes or preservatives °· pregnant or trying to get pregnant °· breast-feeding °How should I use this medicine? °This medicine is for infusion into a vein. It is given by a health care professional in a hospital or clinic setting. °A special MedGuide will be given to you before each treatment. Be sure to read this information carefully each time. °Talk to your pediatrician regarding the use of this medicine in children. Special care may be needed. °Overdosage: If you think you have taken too much of this medicine contact a poison control center or emergency room at once. °NOTE: This medicine is only for you. Do not share this medicine with others. °What if I miss a dose? °Keep appointments for follow-up doses as directed. It is important not to miss your dose. Call your doctor or health care professional if you are unable to keep an appointment. °What may interact with this medicine? °· alemtuzumab °· daclizumab °· dimethyl fumarate °· fingolimod °· glatiramer °· interferon beta °· live virus vaccines °· mitoxantrone °· natalizumab °· peginterferon beta °· rituximab °· steroid medicines like prednisone or cortisone °· teriflunomide °This list may not describe all possible interactions. Give your health care provider a list of all the medicines, herbs,  non-prescription drugs, or dietary supplements you use. Also tell them if you smoke, drink alcohol, or use illegal drugs. Some items may interact with your medicine. °What should I watch for while using this medicine? °Tell your doctor or healthcare professional if your symptoms do not start to get better or if they get worse. °This medicine can cause serious allergic reactions. To reduce your risk you may need to take medicine before treatment with this medicine. Take your medicine as directed. °Women should inform their doctor if they wish to become pregnant or think they might be pregnant. There is a potential for serious side effects to an unborn child. Talk to your health care professional or pharmacist for more information. Female patients should use effective birth control methods while receiving this medicine and for 6 months after the last dose. °Call your doctor or health care professional for advice if you get a fever, chills or sore throat, or other symptoms of a cold or flu. Do not treat yourself. This drug decreases your body's ability to fight infections. Try to avoid being around people who are sick. °If you have a hepatitis B infection or a history of a hepatitis B infection, talk to your doctor. The symptoms of hepatitis B may get worse if you take this medicine. °In some patients, this medicine may cause a serious brain infection that may cause death. If you have any problems seeing, thinking, speaking, walking, or standing, tell your doctor right away. If you cannot reach your doctor, urgently seek other source of medical care. °This medicine can decrease the response to a vaccine. If you need to get   vaccinated, tell your healthcare professional if you have received this medicine. Extra booster doses may be needed. Talk to your doctor to see if a different vaccination schedule is needed. °Talk to your doctor about your risk of cancer. You may be more at risk for certain types of cancers if you  take this medicine. °What side effects may I notice from receiving this medicine? °Side effects that you should report to your doctor or health care professional as soon as possible: °· allergic reactions like skin rash, itching or hives, swelling of the face, lips, or tongue °· breathing problems °· facial flushing °· fast, irregular heartbeat °· lump or soreness in the breast °· signs and symptoms of herpes such as cold sore, shingles, or genital sores °· signs and symptoms of infection like fever or chills, cough, sore throat, pain or trouble passing urine °· signs and symptoms of low blood pressure like dizziness; feeling faint or lightheaded, falls; unusually weak or tired °· signs and symptoms of progressive multifocal leukoencephalopathy (PML) like changes in vision; clumsiness; confusion; personality changes; weakness on one side of the body °· swelling of the ankles, feet, hands °Side effects that usually do not require medical attention (report these to your doctor or health care professional if they continue or are bothersome): °· back pain °· depressed mood °· diarrhea °· pain, redness, or irritation at site where injected °This list may not describe all possible side effects. Call your doctor for medical advice about side effects. You may report side effects to FDA at 1-800-FDA-1088. °Where should I keep my medicine? °This drug is given in a hospital or clinic and will not be stored at home. °NOTE: This sheet is a summary. It may not cover all possible information. If you have questions about this medicine, talk to your doctor, pharmacist, or health care provider. °© 2020 [employer] (2018-05-26 07:41:53) ° °

## 2020-07-12 DIAGNOSIS — J029 Acute pharyngitis, unspecified: Secondary | ICD-10-CM | POA: Diagnosis not present

## 2020-07-13 DIAGNOSIS — J029 Acute pharyngitis, unspecified: Secondary | ICD-10-CM | POA: Diagnosis not present

## 2020-08-12 DIAGNOSIS — G43709 Chronic migraine without aura, not intractable, without status migrainosus: Secondary | ICD-10-CM | POA: Diagnosis not present

## 2020-08-12 DIAGNOSIS — D72819 Decreased white blood cell count, unspecified: Secondary | ICD-10-CM | POA: Diagnosis not present

## 2020-08-12 DIAGNOSIS — D849 Immunodeficiency, unspecified: Secondary | ICD-10-CM | POA: Diagnosis not present

## 2020-08-12 DIAGNOSIS — Z1231 Encounter for screening mammogram for malignant neoplasm of breast: Secondary | ICD-10-CM | POA: Diagnosis not present

## 2020-08-12 DIAGNOSIS — F32A Depression, unspecified: Secondary | ICD-10-CM | POA: Diagnosis not present

## 2020-08-12 DIAGNOSIS — G35 Multiple sclerosis: Secondary | ICD-10-CM | POA: Diagnosis not present

## 2020-09-01 DIAGNOSIS — I10 Essential (primary) hypertension: Secondary | ICD-10-CM | POA: Diagnosis not present

## 2020-09-01 DIAGNOSIS — E782 Mixed hyperlipidemia: Secondary | ICD-10-CM | POA: Diagnosis not present

## 2020-09-01 DIAGNOSIS — Z0000 Encounter for general adult medical examination without abnormal findings: Secondary | ICD-10-CM | POA: Diagnosis not present

## 2020-09-01 DIAGNOSIS — G35 Multiple sclerosis: Secondary | ICD-10-CM | POA: Diagnosis not present

## 2020-09-08 ENCOUNTER — Other Ambulatory Visit: Payer: Self-pay

## 2020-09-08 ENCOUNTER — Non-Acute Institutional Stay (HOSPITAL_COMMUNITY)
Admission: RE | Admit: 2020-09-08 | Discharge: 2020-09-08 | Disposition: A | Discharge status: Home / Self Care | Payer: Medicare Other | Source: Ambulatory Visit | Attending: Internal Medicine | Admitting: Internal Medicine

## 2020-09-08 DIAGNOSIS — G35 Multiple sclerosis: Secondary | ICD-10-CM | POA: Diagnosis not present

## 2020-09-08 MED ORDER — SODIUM CHLORIDE 0.9 % IV SOLN
600.0000 mg | Freq: Once | INTRAVENOUS | Status: AC
  Administered 2020-09-08: 600 mg via INTRAVENOUS
  Filled 2020-09-08: qty 20

## 2020-09-08 MED ORDER — DIPHENHYDRAMINE HCL 50 MG/ML IJ SOLN
25.0000 mg | Freq: Once | INTRAMUSCULAR | Status: AC
  Administered 2020-09-08: 25 mg via INTRAVENOUS
  Filled 2020-09-08: qty 1

## 2020-09-08 MED ORDER — METHYLPREDNISOLONE SODIUM SUCC 125 MG IJ SOLR
125.0000 mg | Freq: Once | INTRAMUSCULAR | Status: AC
  Administered 2020-09-08: 125 mg via INTRAVENOUS
  Filled 2020-09-08: qty 2

## 2020-09-08 MED ORDER — SODIUM CHLORIDE 0.9 % IV SOLN
INTRAVENOUS | Status: DC | PRN
  Administered 2020-09-08: 250 mL via INTRAVENOUS

## 2020-09-08 MED ORDER — ACETAMINOPHEN 500 MG PO TABS
1000.0000 mg | ORAL_TABLET | Freq: Once | ORAL | Status: AC
  Administered 2020-09-08: 1000 mg via ORAL
  Filled 2020-09-08: qty 2

## 2020-09-08 NOTE — Discharge Instructions (Signed)
Ocrelizumab injection What is this medicine? OCRELIZUMAB (ok re LIZ ue mab) treats multiple sclerosis. It helps to decrease the number of multiple sclerosis relapses. It is not a cure. This medicine may be used for other purposes; ask your health care provider or pharmacist if you have questions. COMMON BRAND NAME(S): OCREVUS What should I tell my health care provider before I take this medicine? They need to know if you have any of these conditions:  cancer  hepatitis B infection  other infection (especially a virus infection such as chickenpox, cold sores, or herpes)  an unusual or allergic reaction to ocrelizumab, other medicines, foods, dyes or preservatives  pregnant or trying to get pregnant  breast-feeding How should I use this medicine? This medicine is for infusion into a vein. It is given by a health care professional in a hospital or clinic setting. A special MedGuide will be given to you before each treatment. Be sure to read this information carefully each time. Talk to your pediatrician regarding the use of this medicine in children. Special care may be needed. Overdosage: If you think you have taken too much of this medicine contact a poison control center or emergency room at once. NOTE: This medicine is only for you. Do not share this medicine with others. What if I miss a dose? Keep appointments for follow-up doses as directed. It is important not to miss your dose. Call your doctor or health care professional if you are unable to keep an appointment. What may interact with this medicine?  alemtuzumab  daclizumab  dimethyl fumarate  fingolimod  glatiramer  interferon beta  live virus vaccines  mitoxantrone  natalizumab  peginterferon beta  rituximab  steroid medicines like prednisone or cortisone  teriflunomide This list may not describe all possible interactions. Give your health care provider a list of all the medicines, herbs,  non-prescription drugs, or dietary supplements you use. Also tell them if you smoke, drink alcohol, or use illegal drugs. Some items may interact with your medicine. What should I watch for while using this medicine? Your condition will be monitored carefully while you are receiving this drug. This drug can cause serious allergic reactions. To reduce your risk, you may need to take drug before treatment with this drug. Take your drug as directed. Do not become pregnant while taking this drug or for 6 months after stopping it. Women should inform their health care provider if they wish to become pregnant or think they might be pregnant. There is potential for serious harm to an unborn child. Talk to your health care provider for more information. This drug may increase your risk of getting an infection. Call your health care provider for advice if you get a fever, chills, or sore throat, or other symptoms of a cold or flu. Do not treat yourself. Try to avoid being around people who are sick. If you have hepatitis B, talk to your health care provider if you plan to stop this drug. The symptoms of hepatitis B may get worse if you stop this drug. In some patients, this drug may cause a serious brain infection that may cause death. If you have any problems seeing, thinking, speaking, walking, or standing, tell your health care provider right away. If you cannot reach your health care provider, urgently seek other source of medical care. This drug can decrease the response to a vaccine. If you need to get vaccinated, tell your health care provider if you have received this drug. Extra  booster doses may be needed. Talk to your health care provider to see if a different vaccination schedule is needed. Talk to your health care provider about your risk of cancer. You may be more at risk for certain types of cancer if you take this drug. What side effects may I notice from receiving this medicine? Side effects that  you should report to your doctor or health care professional as soon as possible:  allergic reactions (skin rash, itching or hives; swelling of the face, lips, or tongue)  facial flushing  fast, irregular heartbeat  infection (fever, chills, cough, sore throat, pain or trouble passing urine)  liver injury (dark yellow or brown urine; general ill feeling or flu-like symptoms; loss of appetite, right upper belly pain; unusually weak or tired, yellowing of the eyes or skin)  low blood pressure (dizziness; feeling faint or lightheaded, falls; unusually weak or tired)  swelling of the ankles, feet, hands  trouble breathing Side effects that usually do not require medical attention (report these to your doctor or health care professional if they continue or are bothersome):  back pain  depressed mood  diarrhea  pain, redness, or irritation at site where injected This list may not describe all possible side effects. Call your doctor for medical advice about side effects. You may report side effects to FDA at 1-800-FDA-1088. Where should I keep my medicine? This drug is given in a hospital or clinic and will not be stored at home. NOTE: This sheet is a summary. It may not cover all possible information. If you have questions about this medicine, talk to your doctor, pharmacist, or health care provider.  2021 [employer]vier/Gold Standard (2019-04-21 13:09:45)

## 2020-09-08 NOTE — Progress Notes (Signed)
Patient received IV Ocrevus as ordered by Effie Shy, MD. Pre infusion medications were given as ordered. Patient declined the 60 minutes post infusion observation.Tolerated well, vitals stable, discharge instructions given, verbalized understanding. Alert, oriented and ambulatory at the time of discharge.

## 2020-11-15 DIAGNOSIS — F32A Depression, unspecified: Secondary | ICD-10-CM | POA: Diagnosis not present

## 2020-11-15 DIAGNOSIS — D849 Immunodeficiency, unspecified: Secondary | ICD-10-CM | POA: Diagnosis not present

## 2020-11-15 DIAGNOSIS — D72819 Decreased white blood cell count, unspecified: Secondary | ICD-10-CM | POA: Diagnosis not present

## 2020-11-15 DIAGNOSIS — G35 Multiple sclerosis: Secondary | ICD-10-CM | POA: Diagnosis not present

## 2021-02-21 DIAGNOSIS — D72819 Decreased white blood cell count, unspecified: Secondary | ICD-10-CM | POA: Diagnosis not present

## 2021-02-21 DIAGNOSIS — D849 Immunodeficiency, unspecified: Secondary | ICD-10-CM | POA: Diagnosis not present

## 2021-02-21 DIAGNOSIS — F32A Depression, unspecified: Secondary | ICD-10-CM | POA: Diagnosis not present

## 2021-02-21 DIAGNOSIS — G35 Multiple sclerosis: Secondary | ICD-10-CM | POA: Diagnosis not present

## 2021-03-10 ENCOUNTER — Other Ambulatory Visit: Payer: Self-pay

## 2021-03-10 ENCOUNTER — Non-Acute Institutional Stay (HOSPITAL_COMMUNITY)
Admission: RE | Admit: 2021-03-10 | Discharge: 2021-03-10 | Disposition: A | Discharge status: Home / Self Care | Payer: Medicare Other | Source: Ambulatory Visit | Attending: Internal Medicine | Admitting: Internal Medicine

## 2021-03-10 DIAGNOSIS — G35 Multiple sclerosis: Secondary | ICD-10-CM | POA: Diagnosis not present

## 2021-03-10 MED ORDER — ACETAMINOPHEN 500 MG PO TABS
1000.0000 mg | ORAL_TABLET | Freq: Once | ORAL | Status: AC
  Administered 2021-03-10: 1000 mg via ORAL
  Filled 2021-03-10: qty 2

## 2021-03-10 MED ORDER — SODIUM CHLORIDE 0.9 % IV SOLN
600.0000 mg | Freq: Once | INTRAVENOUS | Status: AC
  Administered 2021-03-10: 600 mg via INTRAVENOUS
  Filled 2021-03-10: qty 20

## 2021-03-10 MED ORDER — DIPHENHYDRAMINE HCL 50 MG/ML IJ SOLN
25.0000 mg | Freq: Once | INTRAMUSCULAR | Status: AC
  Administered 2021-03-10: 25 mg via INTRAVENOUS
  Filled 2021-03-10: qty 1

## 2021-03-10 MED ORDER — METHYLPREDNISOLONE SODIUM SUCC 125 MG IJ SOLR
125.0000 mg | Freq: Once | INTRAMUSCULAR | Status: AC
  Administered 2021-03-10: 125 mg via INTRAVENOUS
  Filled 2021-03-10: qty 2

## 2021-03-10 MED ORDER — SODIUM CHLORIDE 0.9 % IV SOLN
INTRAVENOUS | Status: DC | PRN
  Administered 2021-03-10: 250 mL via INTRAVENOUS

## 2021-03-10 NOTE — Progress Notes (Signed)
Patient received IV Ocrevus 600mg  as ordered by Delphia Grates MD. Pre infusion medications - Tylenol PO, Benadryl IV, IV Solu-medrol were given. Medication was titrated per protocol. Patient declined to wait for the 60 minutes post infusion observation. Tolerated well, vitals stable, discharge instructions given, verbalized understanding. Patient alert, oriented and ambulatory at the time of discharge

## 2021-06-15 DIAGNOSIS — K5730 Diverticulosis of large intestine without perforation or abscess without bleeding: Secondary | ICD-10-CM | POA: Diagnosis not present

## 2021-06-15 DIAGNOSIS — Z1211 Encounter for screening for malignant neoplasm of colon: Secondary | ICD-10-CM | POA: Diagnosis not present

## 2021-06-27 DIAGNOSIS — D72819 Decreased white blood cell count, unspecified: Secondary | ICD-10-CM | POA: Diagnosis not present

## 2021-06-27 DIAGNOSIS — G35 Multiple sclerosis: Secondary | ICD-10-CM | POA: Diagnosis not present

## 2021-06-27 DIAGNOSIS — F32A Depression, unspecified: Secondary | ICD-10-CM | POA: Diagnosis not present

## 2021-06-28 ENCOUNTER — Other Ambulatory Visit: Payer: Self-pay | Admitting: Psychiatry

## 2021-06-28 DIAGNOSIS — G35 Multiple sclerosis: Secondary | ICD-10-CM

## 2021-07-31 ENCOUNTER — Other Ambulatory Visit: Payer: Self-pay

## 2021-07-31 ENCOUNTER — Ambulatory Visit
Admission: RE | Admit: 2021-07-31 | Discharge: 2021-07-31 | Disposition: A | Discharge status: Home / Self Care | Payer: Medicare Other | Source: Ambulatory Visit | Attending: Psychiatry | Admitting: Psychiatry

## 2021-07-31 DIAGNOSIS — G35 Multiple sclerosis: Secondary | ICD-10-CM

## 2021-07-31 MED ORDER — GADOBENATE DIMEGLUMINE 529 MG/ML IV SOLN
19.0000 mL | Freq: Once | INTRAVENOUS | Status: AC | PRN
  Administered 2021-07-31: 19 mL via INTRAVENOUS

## 2021-08-14 ENCOUNTER — Other Ambulatory Visit: Payer: Self-pay | Admitting: Internal Medicine

## 2021-08-14 DIAGNOSIS — I10 Essential (primary) hypertension: Secondary | ICD-10-CM | POA: Diagnosis not present

## 2021-08-14 DIAGNOSIS — E782 Mixed hyperlipidemia: Secondary | ICD-10-CM | POA: Diagnosis not present

## 2021-08-14 DIAGNOSIS — Z1231 Encounter for screening mammogram for malignant neoplasm of breast: Secondary | ICD-10-CM | POA: Diagnosis not present

## 2021-08-14 DIAGNOSIS — F5101 Primary insomnia: Secondary | ICD-10-CM | POA: Diagnosis not present

## 2021-08-14 DIAGNOSIS — G35 Multiple sclerosis: Secondary | ICD-10-CM | POA: Diagnosis not present

## 2021-08-28 DIAGNOSIS — Z0000 Encounter for general adult medical examination without abnormal findings: Secondary | ICD-10-CM | POA: Diagnosis not present

## 2021-08-28 DIAGNOSIS — I10 Essential (primary) hypertension: Secondary | ICD-10-CM | POA: Diagnosis not present

## 2021-08-28 DIAGNOSIS — E559 Vitamin D deficiency, unspecified: Secondary | ICD-10-CM | POA: Diagnosis not present

## 2021-08-28 DIAGNOSIS — E538 Deficiency of other specified B group vitamins: Secondary | ICD-10-CM | POA: Diagnosis not present

## 2021-08-28 DIAGNOSIS — E782 Mixed hyperlipidemia: Secondary | ICD-10-CM | POA: Diagnosis not present

## 2021-08-28 DIAGNOSIS — R5382 Chronic fatigue, unspecified: Secondary | ICD-10-CM | POA: Diagnosis not present

## 2021-08-28 DIAGNOSIS — F5101 Primary insomnia: Secondary | ICD-10-CM | POA: Diagnosis not present

## 2021-08-28 DIAGNOSIS — G35 Multiple sclerosis: Secondary | ICD-10-CM | POA: Diagnosis not present

## 2021-09-07 ENCOUNTER — Non-Acute Institutional Stay (HOSPITAL_COMMUNITY)
Admission: RE | Admit: 2021-09-07 | Discharge: 2021-09-07 | Disposition: A | Discharge status: Home / Self Care | Payer: Medicare Other | Source: Ambulatory Visit | Attending: Internal Medicine | Admitting: Internal Medicine

## 2021-09-07 ENCOUNTER — Telehealth (HOSPITAL_COMMUNITY): Payer: Self-pay | Admitting: *Deleted

## 2021-09-07 DIAGNOSIS — G35 Multiple sclerosis: Secondary | ICD-10-CM | POA: Diagnosis not present

## 2021-09-07 MED ORDER — DIPHENHYDRAMINE HCL 50 MG/ML IJ SOLN
25.0000 mg | Freq: Once | INTRAMUSCULAR | Status: AC
  Administered 2021-09-07: 25 mg via INTRAVENOUS
  Filled 2021-09-07: qty 1

## 2021-09-07 MED ORDER — METHYLPREDNISOLONE SODIUM SUCC 125 MG IJ SOLR
125.0000 mg | Freq: Once | INTRAMUSCULAR | Status: AC
  Administered 2021-09-07: 125 mg via INTRAVENOUS
  Filled 2021-09-07: qty 2

## 2021-09-07 MED ORDER — SODIUM CHLORIDE 0.9 % IV SOLN: INTRAVENOUS | Status: DC | PRN

## 2021-09-07 MED ORDER — ACETAMINOPHEN 500 MG PO TABS
1000.0000 mg | ORAL_TABLET | Freq: Once | ORAL | Status: AC
  Administered 2021-09-07: 1000 mg via ORAL
  Filled 2021-09-07: qty 2

## 2021-09-07 MED ORDER — SODIUM CHLORIDE 0.9 % IV SOLN
600.0000 mg | Freq: Once | INTRAVENOUS | Status: AC
  Administered 2021-09-07: 600 mg via INTRAVENOUS
  Filled 2021-09-07: qty 20

## 2021-09-07 NOTE — Telephone Encounter (Signed)
Called and spoke with Hoy Register, provider over the infusion orders at [facility]. Patient is scheduled for Ocrevus infusion and has labs ordered along with infusion. Advised Beth that our clinic is unable to pull these labs due to availability of lab tubes and the inability to place the lab orders in Epic. Patient will need to get labs elsewhere. Beth will advised ordering providers.   ?

## 2021-09-07 NOTE — Progress Notes (Signed)
Patient received IV Ocrevus as ordered by Effie Shy, MD. Pre infusion medications were given as ordered. Patient declined the 60 minutes post infusion observation.Tolerated well, vitals stable, declined printed AVS. Alert, oriented and ambulatory at the time of discharge.  ?

## 2021-09-18 ENCOUNTER — Ambulatory Visit
Admission: RE | Admit: 2021-09-18 | Discharge: 2021-09-18 | Disposition: A | Discharge status: Home / Self Care | Payer: No Typology Code available for payment source | Source: Ambulatory Visit | Attending: Internal Medicine | Admitting: Internal Medicine

## 2021-09-18 DIAGNOSIS — E782 Mixed hyperlipidemia: Secondary | ICD-10-CM

## 2021-09-18 DIAGNOSIS — I2584 Coronary atherosclerosis due to calcified coronary lesion: Secondary | ICD-10-CM | POA: Diagnosis not present

## 2021-10-01 NOTE — Progress Notes (Signed)
[facility]  PATIENT: [name] DOB: [birth year]  REFERRING DOCTOR OR PCP:  Hillard Danker, MD; Eloy End, MD [facility] neurology) SOURCE: Patient, notes from [facility] neurology, imaging and laboratory reports, MRI images personally reviewed  _________________________________   HISTORICAL  CHIEF COMPLAINT:  Chief Complaint  Patient presents with   New Patient (Initial Visit)    Rm 2, alone. Here for TOC for MS, on Ocrevus 600mg  and tolerating well. Last infusion: 4/6//23 and Next infusion: 03/09/22.     HISTORY OF PRESENT ILLNESS:  I had the pleasure of seeing your patient, [name], at the [facility] at [facility] for neurologic consultation regarding her multiple sclerosis.  She is a 51 yo woman with MS on Ocrevus transferring care from [facility]  Currently, she is stable.    She notes that her stance is mildly wide and the gait is wide but she could walk one mile without support    She has some stumbles but no falls.   Her right leg is more clumsy than the left and gives out on hr at times if more tired.    She denies numbness or tingling now but the right side will sometimes have mild paresthesias.   She gets stabbing dysesthesias in her left occiput and temple region.  Sometimes she has similar right orbit pain.    These episodes are more likely to occur when more tired or hot.    She feels bladder function is good.   Vision is slighly worse OD.   She needs reading glasses.  She reports fatigue.   She feels worse if she eats sweet or fatty foods.   She has sleep maintenance insomnia and wakes up a couple times a night and takes a while to fall back asleep.     She is on fluoxetine 60 mg for depression and anxiety.    Fluoxetine helps anxiety better than the depression.    She has reduced attention and is on methylphenidate with benefit..     MS History: She was diagnosed with MS in August 2009 after presenting with  right sided numbness.   She was referred to Dr. Terrace Arabia and she had MRi's c/w MS.  She was placed on Copaxone.    She started to see Dr. Leotis Shames for a second opinion and was placed on Tysabri.   For a couple years, I saw her  in a different practice as out infusion center was closer to her.   When Ocrevus became available around 2018, she switched and continues on it.   She has tolerated it well.    She did have one exacerbation in 2018 or 2019 and did a couple days of IV steroids.      Imaging: MRI of the brain 07/31/2021 showed no new lesions compared to the 06/14/2017 MRI.  Foci are noted in the cerebellum, right middle cerebellar peduncle anterior pons, left posterior midbrain, and in the periventricular, juxtacortical and deep white matter of the hemispheres.  None of the foci enhanced  MRI of the brain 06/14/2017 showed T2/FLAIR hyperintense foci in the cerebellum, right middle cerebellar peduncle, anterior pons, left posterior midbrain, and in the periventricular, juxtacortical and deep white matter of the hemispheres.  None of the foci enhanced or appear to be acute.  MRI of the cervical and thoracic spine 01/31/2008 showed a normal spinal cord.  There is an atypical hemangioma within the T5 vertebral body.  REVIEW OF SYSTEMS: Constitutional:  No fevers, chills, sweats, or change in appetite Eyes: No visual changes, double vision, eye pain Ear, nose and throat: No hearing loss, ear pain, nasal congestion, sore throat Cardiovascular: No chest pain, palpitations Respiratory:  No shortness of breath at rest or with exertion.   No wheezes GastrointestinaI: No nausea, vomiting, diarrhea, abdominal pain, fecal incontinence Genitourinary:  No dysuria, urinary retention or frequency.  No nocturia. Musculoskeletal:  No neck pain, back pain Integumentary: No rash, pruritus, skin lesions Neurological: as above Psychiatric: No depression at this time.  No anxiety Endocrine: No palpitations, diaphoresis,  change in appetite, change in weigh or increased thirst Hematologic/Lymphatic:  No anemia, purpura, petechiae. Allergic/Immunologic: No itchy/runny eyes, nasal congestion, recent allergic reactions, rashes  ALLERGIES: Allergies  Allergen Reactions   Statins Hives    HOME MEDICATIONS:  Current Outpatient Medications:    aspirin 81 MG chewable tablet, Chew 1 tablet by mouth every other day. Bayer Brand, Disp: , Rfl:    cholecalciferol (VITAMIN D3) 25 MCG (1000 UNIT) tablet, Take 1,000 Units by mouth daily., Disp: , Rfl:    FLUoxetine HCl 60 MG TABS, Take 1 tablet by mouth daily., Disp: , Rfl:    methylphenidate (RITALIN) 20 MG tablet, Take 20 mg by mouth 2 (two) times daily as needed., Disp: , Rfl:    Multiple Vitamin (ONE DAILY MULTIVITAMIN ADULT) TABS, Take 1 tablet by mouth daily., Disp: , Rfl:    ocrelizumab 600 mg in sodium chloride 0.9 % 500 mL, Inject 600 mg into the vein every 6 (six) months., Disp: , Rfl:    Omega-3 Fatty Acids (FISH OIL) 1000 MG CAPS, Take by mouth daily., Disp: , Rfl:    triamterene-hydrochlorothiazide (MAXZIDE-25) 37.5-25 MG per tablet, Take 1 tablet by mouth daily., Disp: , Rfl:    valACYclovir (VALTREX) 1000 MG tablet, Take 1,000 mg by mouth daily., Disp: , Rfl:   PAST MEDICAL HISTORY: Past Medical History:  Diagnosis Date   Anxiety    Chest pain    pressure in chest- ?related to MS- normal EKGs   Depression    Fatigue    related to MS   Headache    High cholesterol    Multiple sclerosis (HCC)    Neuromuscular disorder (HCC)    Multiple sclerosis   Shortness of breath dyspnea    on exertion- lack of exercise    Vaginal delivery 1999, 2003    PAST SURGICAL HISTORY: Past Surgical History:  Procedure Laterality Date   ABDOMINAL HYSTERECTOMY     BILATERAL SALPINGECTOMY Bilateral 02/09/2015   Procedure: BILATERAL SALPINGECTOMY;  Surgeon: Myna Hidalgo, DO;  Location: [facility];  Service: Gynecology;  Laterality: Bilateral;   CHOLECYSTECTOMY      WISDOM TOOTH EXTRACTION      FAMILY HISTORY: Family History  Problem Relation Age of Onset   Breast cancer Mother    High Cholesterol Mother    High blood pressure Mother    High Cholesterol Father    High blood pressure Father    Glaucoma Father    Dementia Father        not fully dx yet   Aneurysm Maternal Grandmother    Glaucoma Paternal Grandmother     SOCIAL HISTORY:  Social History   Socioeconomic History   Marital status: Legally Separated    Spouse name: Not on file   Number of children: 2   Years of education: Not on file   Highest education level: Bachelor's degree (e.g., BA, AB, BS)  Occupational History  Not on file  Tobacco Use   Smoking status: Never   Smokeless tobacco: Never  Vaping Use   Vaping Use: Never used  Substance and Sexual Activity   Alcohol use: Yes    Alcohol/week: 2.0 standard drinks    Types: 2 Glasses of wine per week   Drug use: Never   Sexual activity: Not on file  Other Topics Concern   Not on file  Social History Narrative   Lives w daughter    R handed   Caffeine: varies, 4 servings in a week   Social Determinants of Health   Financial Resource Strain: Not on file  Food Insecurity: Not on file  Transportation Needs: Not on file  Physical Activity: Not on file  Stress: Not on file  Social Connections: Not on file  Intimate Partner Violence: Not on file     PHYSICAL EXAM  Vitals:   10/03/21 0845  BP: (!) 142/89  Pulse: 89  Weight: 197 lb (89.4 kg)  Height: 5\' 6"  (1.676 m)    Body mass index is 31.8 kg/m.  Vision Screening   Right eye Left eye Both eyes  Without correction 20/50 20/30 20/50   With correction     Comments: Has not had an eye exam in years. Upcoming eye exam scheduled in July. Uses readers    General: The patient is well-developed and well-nourished and in no acute distress  HEENT:  Head is [place]/AT.  Sclera are anicteric.  Funduscopic exam shows normal optic discs and retinal  vessels.  Neck: No carotid bruits are noted.  The neck is tender at the left occiput  Cardiovascular: The heart has a regular rate and rhythm with a normal S1 and S2. There were no murmurs, gallops or rubs.    Skin: Extremities are without rash or  edema.  Musculoskeletal:  Back is nontender  Neurologic Exam  Mental status: The patient is alert and oriented x 3 at the time of the examination. The patient has apparent normal recent and remote memory, with an apparently normal attention span and concentration ability.   Speech is normal.  Cranial nerves: Extraocular movements are full. Pupils are equal, round, and reactive to light and accomodation.  VNormal color vision.  Facial symmetry is present. There is good facial sensation to soft touch bilaterally.Facial strength is normal.  Trapezius and sternocleidomastoid strength is normal. No dysarthria is noted.  The tongue is midline, and the patient has symmetric elevation of the soft palate. No obvious hearing deficits are noted.  Motor:  Muscle bulk is normal.   Tone is normal. Strength is  5 / 5 in all 4 extremities.   Sensory: Sensory testing is intact to pinprick, soft touch and vibration sensation in the arms, mild reduced vibration sensation in the left leg.  Coordination: Cerebellar testing reveals good finger-nose-finger and heel-to-shin bilaterally.  Gait and station: Station is normal.   Gait is mildly wide and tandem is wide.   Romberg is negative.   Reflexes: Deep tendon reflexes are symmetric and normal bilaterally.         ASSESSMENT AND PLAN  Multiple sclerosis (HCC)  Gait disturbance  High risk medication use  Depression with anxiety  Insomnia, unspecified type  Attention deficit disorder (ADD) in adult  Other fatigue    In summary, Ms. Houston is a 51 year old woman with relapsing remitting multiple sclerosis who is currently on Ocrevus therapy.  She has done well on this disease modifying therapy and has  not had  any recent exacerbations.  Her last MRI was unchanged.  We discussed continuing the Ocrevus.  She lives much closer to our practice and we will set her up for an infusion in the office.  I will check blood work for IgG/IgM and chronic hepatitis.  She will return to see [place] in about 6 months or sooner if there are new or worsening neurologic symptoms.  Thank you for asking me to see Ms. Randie Heinz.  Please let me know if I be of further assistance with her or other patients in the future.    Essa Wenk A. Epimenio Foot, MD, [facility] [place] 10/03/2021, 9:54 AM Certified in Neurology, Clinical Neurophysiology, Sleep Medicine and Neuroimaging  [facility] [address] [place], [place] [address] [phone]

## 2021-10-03 ENCOUNTER — Ambulatory Visit: Payer: Medicare Other | Admitting: Neurology

## 2021-10-03 ENCOUNTER — Telehealth: Payer: Self-pay

## 2021-10-03 ENCOUNTER — Encounter: Payer: Self-pay | Admitting: Neurology

## 2021-10-03 VITALS — BP 142/89 | HR 89 | Ht 66.0 in | Wt 197.0 lb

## 2021-10-03 DIAGNOSIS — Z79899 Other long term (current) drug therapy: Secondary | ICD-10-CM | POA: Diagnosis not present

## 2021-10-03 DIAGNOSIS — G4700 Insomnia, unspecified: Secondary | ICD-10-CM

## 2021-10-03 DIAGNOSIS — R5383 Other fatigue: Secondary | ICD-10-CM | POA: Diagnosis not present

## 2021-10-03 DIAGNOSIS — F418 Other specified anxiety disorders: Secondary | ICD-10-CM | POA: Diagnosis not present

## 2021-10-03 DIAGNOSIS — G35D Multiple sclerosis, unspecified: Secondary | ICD-10-CM

## 2021-10-03 DIAGNOSIS — F988 Other specified behavioral and emotional disorders with onset usually occurring in childhood and adolescence: Secondary | ICD-10-CM

## 2021-10-03 DIAGNOSIS — R269 Unspecified abnormalities of gait and mobility: Secondary | ICD-10-CM

## 2021-10-03 DIAGNOSIS — G35 Multiple sclerosis: Secondary | ICD-10-CM | POA: Diagnosis not present

## 2021-10-03 NOTE — Telephone Encounter (Signed)
Request made on 10/03/2021 [facility] [phone]. ?

## 2021-10-03 NOTE — Telephone Encounter (Signed)
Received Ocrevus start form from Dr. Felecia Shelling. Patient is currently on Ocrevus. Due for next infusion in October. ? ?I don't have records of her pre Ocrevus labs that will need to be provided to the Infusion Suite prior to scheduling. ? ?I called [facility] but was forced to leave a message requesting these labs. These labs were likely done in 2018 prior to her starting Ocrevus. ? ?Need results for: ?CBC diff ?CMP ?Hep B Surface Ag ?Hep B Surface AB ?Hep B Core AB ?quant TB ?

## 2021-10-05 ENCOUNTER — Other Ambulatory Visit: Payer: Self-pay | Admitting: Neurology

## 2021-10-05 DIAGNOSIS — G35 Multiple sclerosis: Secondary | ICD-10-CM

## 2021-10-05 DIAGNOSIS — R768 Other specified abnormal immunological findings in serum: Secondary | ICD-10-CM

## 2021-10-05 DIAGNOSIS — Z79899 Other long term (current) drug therapy: Secondary | ICD-10-CM

## 2021-10-05 LAB — IGG, IGA, IGM
IgA/Immunoglobulin A, Serum: 167 mg/dL (ref 87–352)
IgG (Immunoglobin G), Serum: 842 mg/dL (ref 586–1602)
IgM (Immunoglobulin M), Srm: 52 mg/dL (ref 26–217)

## 2021-10-05 LAB — VIRAL HEPATITIS HBV, HCV
HCV Ab: NONREACTIVE
Hep B Core Total Ab: NEGATIVE
Hep B Surface Ab, Qual: NONREACTIVE
Hepatitis B Surface Ag: POSITIVE — AB

## 2021-10-05 LAB — HCV INTERPRETATION

## 2021-10-09 ENCOUNTER — Encounter: Payer: Self-pay | Admitting: Neurology

## 2021-10-17 ENCOUNTER — Other Ambulatory Visit (INDEPENDENT_AMBULATORY_CARE_PROVIDER_SITE_OTHER): Payer: Self-pay

## 2021-10-17 DIAGNOSIS — Z0289 Encounter for other administrative examinations: Secondary | ICD-10-CM

## 2021-10-17 DIAGNOSIS — R768 Other specified abnormal immunological findings in serum: Secondary | ICD-10-CM

## 2021-10-17 DIAGNOSIS — G35 Multiple sclerosis: Secondary | ICD-10-CM

## 2021-10-17 DIAGNOSIS — Z79899 Other long term (current) drug therapy: Secondary | ICD-10-CM | POA: Diagnosis not present

## 2021-10-19 LAB — HEPATIC FUNCTION PANEL
ALT: 30 IU/L (ref 0–32)
AST: 19 IU/L (ref 0–40)
Albumin: 4.2 g/dL (ref 3.8–4.9)
Alkaline Phosphatase: 81 IU/L (ref 44–121)
Bilirubin Total: 0.4 mg/dL (ref 0.0–1.2)
Bilirubin, Direct: 0.1 mg/dL (ref 0.00–0.40)
Total Protein: 6.3 g/dL (ref 6.0–8.5)

## 2021-10-19 LAB — HEPATITIS B SURFACE ANTIGEN: Hepatitis B Surface Ag: POSITIVE — AB

## 2021-10-19 LAB — CBC WITH DIFFERENTIAL/PLATELET
Basophils Absolute: 0 10*3/uL (ref 0.0–0.2)
Basos: 1 %
EOS (ABSOLUTE): 0.1 10*3/uL (ref 0.0–0.4)
Eos: 2 %
Hematocrit: 42.7 % (ref 34.0–46.6)
Hemoglobin: 14.2 g/dL (ref 11.1–15.9)
Immature Grans (Abs): 0 10*3/uL (ref 0.0–0.1)
Immature Granulocytes: 0 %
Lymphocytes Absolute: 1.9 10*3/uL (ref 0.7–3.1)
Lymphs: 35 %
MCH: 28.9 pg (ref 26.6–33.0)
MCHC: 33.3 g/dL (ref 31.5–35.7)
MCV: 87 fL (ref 79–97)
Monocytes Absolute: 0.6 10*3/uL (ref 0.1–0.9)
Monocytes: 11 %
Neutrophils Absolute: 2.9 10*3/uL (ref 1.4–7.0)
Neutrophils: 51 %
Platelets: 245 10*3/uL (ref 150–450)
RBC: 4.92 x10E6/uL (ref 3.77–5.28)
RDW: 13.4 % (ref 11.7–15.4)
WBC: 5.5 10*3/uL (ref 3.4–10.8)

## 2021-10-19 LAB — HEPATITIS B CORE ANTIBODY, TOTAL: Hep B Core Total Ab: NEGATIVE

## 2021-10-23 ENCOUNTER — Other Ambulatory Visit: Payer: Self-pay | Admitting: Neurology

## 2021-10-23 ENCOUNTER — Encounter: Payer: Self-pay | Admitting: Neurology

## 2021-10-23 ENCOUNTER — Other Ambulatory Visit: Payer: Self-pay | Admitting: *Deleted

## 2021-10-23 ENCOUNTER — Telehealth: Payer: Self-pay | Admitting: Neurology

## 2021-10-23 DIAGNOSIS — R768 Other specified abnormal immunological findings in serum: Secondary | ICD-10-CM | POA: Diagnosis not present

## 2021-10-23 DIAGNOSIS — Z79899 Other long term (current) drug therapy: Secondary | ICD-10-CM

## 2021-10-23 MED ORDER — METHYLPHENIDATE HCL 20 MG PO TABS: 20.0000 mg | ORAL_TABLET | Freq: Two times a day (BID) | ORAL | 0 refills | Status: DC | PRN

## 2021-10-23 NOTE — Telephone Encounter (Signed)
Referral for Gastroenterology sent to [facility] [phone].

## 2021-10-27 DIAGNOSIS — R768 Other specified abnormal immunological findings in serum: Secondary | ICD-10-CM | POA: Diagnosis not present

## 2021-12-06 ENCOUNTER — Encounter: Payer: Self-pay | Admitting: Internal Medicine

## 2021-12-06 ENCOUNTER — Ambulatory Visit: Payer: Medicare Other | Admitting: Internal Medicine

## 2021-12-06 VITALS — BP 138/82 | HR 83 | Ht 66.0 in | Wt 191.8 lb

## 2021-12-06 DIAGNOSIS — Z888 Allergy status to other drugs, medicaments and biological substances status: Secondary | ICD-10-CM

## 2021-12-06 DIAGNOSIS — E7849 Other hyperlipidemia: Secondary | ICD-10-CM | POA: Diagnosis not present

## 2021-12-06 DIAGNOSIS — B181 Chronic viral hepatitis B without delta-agent: Secondary | ICD-10-CM

## 2021-12-06 NOTE — Progress Notes (Signed)
LIPID CLINIC CONSULT NOTE  Chief Complaint:  Manage dyslipidemia  Primary Care Physician: Beverly Ferretti, MD  Primary Cardiologist:  None  HPI:  [name] is a 51 y.o. female who is being seen today for the evaluation of dyslipidemia at the request of Beverly Ferretti, MD. This is a pleasant 51 year old female kindly referred for evaluation management of dyslipidemia.  She reports longstanding history of high cholesterol back into her 12s.  She is felt that her diet has been not ideal although there is some heart disease in the family.  In the past she had been tried on statins but had a significant whole body rash and was thought to be allergic.  She had also apparently tried ezetimibe but was taken off of that due to intolerance.  More recently she has been on no therapy for her cholesterol.  She did have lab work in March 2023 which showed total cholesterol 275, HDL 54, triglycerides 102 and LDL 203.  In addition in April she underwent coronary artery calcium scoring.  The calcium score is elevated showing a calcium score of 56, 96th percentile for age and sex matched controls.  Based on that she was referred for additional therapy.  Of note, she has a diagnosis of multiple sclerosis on Ocrevus (anti-CD20 antibody therapy) -it is recommended that she have surveillance viral testing on this medication and she did recently obtain a diagnosis of chronic hepatitis B.  She was referred to see Beverly Locks, NP and plans to start a suppressive antiviral therapy with Vemlidy.  She reports her diet does have a number of fatty or fried foods and we talked about the importance of reducing saturated fats and increasing exercise.  PMHx:  Past Medical History:  Diagnosis Date   Anxiety    Chest pain    pressure in chest- ?related to MS- normal EKGs   Depression    Fatigue    related to MS   Headache    High cholesterol    Multiple sclerosis (HCC)    Neuromuscular disorder (HCC)    Multiple  sclerosis   Shortness of breath dyspnea    on exertion- lack of exercise    Vaginal delivery 1999, 2003    Past Surgical History:  Procedure Laterality Date   ABDOMINAL HYSTERECTOMY     BILATERAL SALPINGECTOMY Bilateral 02/09/2015   Procedure: BILATERAL SALPINGECTOMY;  Surgeon: Beverly Pupa, DO;  Location: [facility];  Service: Gynecology;  Laterality: Bilateral;   CHOLECYSTECTOMY     WISDOM TOOTH EXTRACTION      FAMHx:  Family History  Problem Relation Age of Onset   Breast cancer Mother    High Cholesterol Mother    High blood pressure Mother    High Cholesterol Father    High blood pressure Father    Glaucoma Father    Dementia Father        not fully dx yet   Aneurysm Maternal Grandmother    Glaucoma Paternal Grandmother     SOCHx:   reports that she has never smoked. She has never used smokeless tobacco. She reports current alcohol use of about 2.0 standard drinks of alcohol per week. She reports that she does not use drugs.  ALLERGIES:  Allergies  Allergen Reactions   Statins Hives   Zetia [Ezetimibe]     ROS: Pertinent items noted in HPI and remainder of comprehensive ROS otherwise negative.  HOME MEDS: Current Outpatient Medications on File Prior to Visit  Medication  Sig Dispense Refill   aspirin 81 MG chewable tablet Chew 1 tablet by mouth every other day. Bayer Brand     cholecalciferol (VITAMIN D3) 25 MCG (1000 UNIT) tablet Take 1,000 Units by mouth daily.     FLUoxetine HCl 60 MG TABS Take 1 tablet by mouth daily.     methylphenidate (RITALIN) 20 MG tablet Take 1 tablet (20 mg total) by mouth 2 (two) times daily as needed. 60 tablet 0   Multiple Vitamin (ONE DAILY MULTIVITAMIN ADULT) TABS Take 1 tablet by mouth daily.     ocrelizumab 600 mg in sodium chloride 0.9 % 500 mL Inject 600 mg into the vein every 6 (six) months.     Omega-3 Fatty Acids (FISH OIL) 1000 MG CAPS Take by mouth daily.     triamterene-hydrochlorothiazide (MAXZIDE-25) 37.5-25 MG per  tablet Take 1 tablet by mouth daily.     valACYclovir (VALTREX) 1000 MG tablet Take 1,000 mg by mouth daily.     No current facility-administered medications on file prior to visit.    LABS/IMAGING: No results found for this or any previous visit (from the past 48 hour(s)). No results found.  LIPID PANEL: No results found for: "CHOL", "TRIG", "HDL", "CHOLHDL", "VLDL", "LDLCALC", "LDLDIRECT"  WEIGHTS: Wt Readings from Last 3 Encounters:  12/06/21 191 lb 12.8 oz (87 kg)  10/03/21 197 lb (89.4 kg)  01/22/19 194 lb 8 oz (88.2 kg)    VITALS: BP 138/82   Pulse 83   Ht '5\' 6"'$  (1.676 m)   Wt 191 lb 12.8 oz (87 kg)   SpO2 98%   BMI 30.96 kg/m   EXAM: General appearance: alert and no distress Neck: no carotid bruit, no JVD, and thyroid not enlarged, symmetric, no tenderness/mass/nodules Lungs: clear to auscultation bilaterally Heart: regular rate and rhythm, S1, S2 normal, no murmur, click, rub or gallop Abdomen: soft, non-tender; bowel sounds normal; no masses,  no organomegaly Extremities: extremities normal, atraumatic, no cyanosis or edema Pulses: 2+ and symmetric Skin: Skin color, texture, turgor normal. No rashes or lesions Neurologic: Grossly normal Psych: Pleasant  EKG: N/A  ASSESSMENT: Probable familial hyperlipidemia, LDL greater than 190 ([name] Criteria) High cholesterol in both parents Statin allergic-hives Ezetimibe intolerant Elevated CAC score of 49, 96th percentile for age and sex matched controls (09/2021) Chronic hepatitis B-anticipating treatment Multiple sclerosis on Ocrevus  PLAN: 1.   [name] has probable familial hyperlipidemia with LDL greater than 190 and family members with high cholesterol.  She also has imaging evidence of coronary artery disease which is age advanced based on her calcium score.  She was allergic to statins and therefore it is contraindicated and could not tolerate ezetimibe.  I think she is a good candidate for PCSK9  inhibitor.  I think this could be used safely with her hepatitis however would like to coordinate this with her liver specialist.  She will need repeat liver enzymes 2 weeks after starting therapy.  We will then otherwise plan a repeat lipid NMR and LP(a) in about 3 to 4 months and follow-up with me afterwards.  Thanks again for the kind referral.  Pixie Casino, MD, FACC, [place] Director of the Advanced Lipid Disorders &  Cardiovascular Risk Reduction Clinic Diplomate of the American Board of Clinical Lipidology Attending Cardiologist  Direct Dial: [phone]  Fax: [phone]  Website:  [email or web address] 12/06/2021, 11:40 AM

## 2021-12-06 NOTE — Patient Instructions (Signed)
Medication Instructions:  Dr. Debara Pickett recommends Repatha Sureclick (PCSK9). This is an injectable cholesterol medication self-administered once every 14 days. This medication will likely need prior approval with your insurance company, which we will work on. If the medication is not approved initially, we may need to do an appeal with your insurance.   Administer medication in area of fatty tissue such as abdomen, outer thigh, back of upper arm - and rotate site with each injection Store medication in refrigerator until ready to administer - allow to sit at room temp for 30 mins - 1 hour prior to injection Dispose of medication in a SHARPS container - your pharmacy should be able to direct you on this and proper disposal   If you need a co-pay card for Repatha: MicroLists.at If you need a co-pay card for Praluent: https://praluentpatientsupport.KnowRentals.uy  Patient Assistance:    These foundations have funds at various times.   The [employer] Foundation: https://www.panfoundation.org/disease-funds/hypercholesterolemia/ -- can sign up for wait list  The [employer] offers assistance to help pay for medication copays.  They will cover copays for all cholesterol lowering meds, including statins, fibrates, omega-3 fish oils like Vascepa, ezetimibe, Repatha, Praluent, Nexletol, Nexlizet.  The cards are usually good for $2,500 or 12 months, whichever comes first. Go to healthwellfoundation.org Click on "Apply Now" Answer questions as to whom is applying (patient or representative) Your disease fund will be "hypercholesterolemia - Medicare access" They will ask questions about finances and which medications you are taking for cholesterol When you submit, the approval is usually within minutes.  You will need to print the card information from the site You will need to show this information to your pharmacy, they will bill your Medicare Part D plan first -then bill Health  Well --for the copay.   You can also call them at [phone], although the hold times can be quite long.     *If you need a refill on your cardiac medications before your next appointment, please call your pharmacy*   Lab Work: Non-Fasting lab test after 2 weeks on new medication   Fasting lab work about 1 week before your next appointment   If you have labs (blood work) drawn today and your tests are completely normal, you will receive your results only by: [place] (if you have MyChart) OR A paper copy in the mail If you have any lab test that is abnormal or we need to change your treatment, we will call you to review the results.   Follow-Up: At [facility], you and your health needs are our priority.  As part of our continuing mission to provide you with exceptional heart care, we have created designated Provider Care Teams.  These Care Teams include your primary Cardiologist (physician) and Advanced Practice Providers (APPs -  Physician Assistants and Nurse Practitioners) who all work together to provide you with the care you need, when you need it.  We recommend signing up for the patient portal called "MyChart".  Sign up information is provided on this After Visit Summary.  MyChart is used to connect with patients for Virtual Visits (Telemedicine).  Patients are able to view lab/test results, encounter notes, upcoming appointments, etc.  Non-urgent messages can be sent to your provider as well.   To learn more about what you can do with MyChart, go to [email or web address].    Your next appointment:   3-4 months with Dr. Debara Pickett -- lipid clinic

## 2021-12-07 ENCOUNTER — Telehealth: Payer: Self-pay

## 2021-12-07 MED ORDER — REPATHA SURECLICK 140 MG/ML ~~LOC~~ SOAJ: 1.0000 | SUBCUTANEOUS | 3 refills | Status: AC

## 2021-12-07 NOTE — Telephone Encounter (Signed)
Patient updated via MyChart message.

## 2021-12-07 NOTE — Addendum Note (Signed)
Addended by: Fidel Levy on: 12/07/2021 03:24 PM   Modules accepted: Orders

## 2021-12-07 NOTE — Telephone Encounter (Addendum)
PA for Repatha submitted and Approved by [facility], Key: [id number] - PA- [id number], through 06/09/2022.

## 2021-12-08 ENCOUNTER — Telehealth: Payer: Self-pay | Admitting: Neurology

## 2021-12-08 NOTE — Telephone Encounter (Signed)
I reviewed the note from [facility].  The diagnosis of hepatitis B was confirmed.  Of note, she has a positive hepatitis surface antigen and HBV DNA is present.  The etiology of the hepatitis B is unknown.    I spoke to [name] about the implications for her MS therapy.  She is going to be started on Vemlidy to suppress the hepatitis.  However, this is not a cure.  Active hepatitis B viral infection is a contraindication for Ocrevus.  Fulminant hepatitis with hepatic failure and even death have occurred on patient she has been treated with anti-CD20 antibodies.  Therefore, we need to discontinue the Ocrevus.  Her last infusion was in April 2023 and her next infusion was to be in October 2023.  The effect on the immune system will probably continue to the end of 2023 but not much beyond.    I would like to have her consider a different disease modifying therapy for her MS..  She has been happy on Ocrevus and would prefer to stay on it but I reiterated that we need to change.  In the past, she had been on Tysabri and was stable.  She switched because of her concerned about JCV and believes that she was JCV negative at that time.  Therefore, Tysabri would be an option.  However, due to her concern about JCV/PML she does not want to go back on that treatment.  Another option that would be less concerning for the hepatitis would be either Tecfidera or Vumerity (these are related).  I will move her appointment up from November to September so that we can discuss different options while the immunologic effects of Ocrevus is still ongoing.

## 2021-12-11 NOTE — Telephone Encounter (Signed)
Called pt. Scheduled sooner appt for 02/08/22 at 9am w/ Dr. Felecia Shelling. Cx appt in Nov since she is coming in sooner.

## 2021-12-12 DIAGNOSIS — G35 Multiple sclerosis: Secondary | ICD-10-CM | POA: Diagnosis not present

## 2021-12-12 DIAGNOSIS — E782 Mixed hyperlipidemia: Secondary | ICD-10-CM | POA: Diagnosis not present

## 2021-12-12 DIAGNOSIS — I10 Essential (primary) hypertension: Secondary | ICD-10-CM | POA: Diagnosis not present

## 2021-12-12 DIAGNOSIS — I2510 Atherosclerotic heart disease of native coronary artery without angina pectoris: Secondary | ICD-10-CM | POA: Diagnosis not present

## 2021-12-26 ENCOUNTER — Encounter: Payer: Self-pay | Admitting: Neurology

## 2021-12-26 ENCOUNTER — Other Ambulatory Visit: Payer: Self-pay | Admitting: *Deleted

## 2021-12-26 DIAGNOSIS — G35 Multiple sclerosis: Secondary | ICD-10-CM | POA: Diagnosis not present

## 2021-12-26 DIAGNOSIS — D3131 Benign neoplasm of right choroid: Secondary | ICD-10-CM | POA: Diagnosis not present

## 2021-12-26 DIAGNOSIS — H40013 Open angle with borderline findings, low risk, bilateral: Secondary | ICD-10-CM | POA: Diagnosis not present

## 2021-12-26 DIAGNOSIS — H53461 Homonymous bilateral field defects, right side: Secondary | ICD-10-CM | POA: Diagnosis not present

## 2021-12-26 MED ORDER — METHYLPHENIDATE HCL 20 MG PO TABS: 20.0000 mg | ORAL_TABLET | Freq: Two times a day (BID) | ORAL | 0 refills | Status: DC | PRN

## 2022-01-01 LAB — HEPATIC FUNCTION PANEL
ALT: 46 IU/L — ABNORMAL HIGH (ref 0–32)
AST: 32 IU/L (ref 0–40)
Albumin: 4.4 g/dL (ref 3.8–4.9)
Alkaline Phosphatase: 100 IU/L (ref 44–121)
Bilirubin Total: 0.6 mg/dL (ref 0.0–1.2)
Bilirubin, Direct: 0.16 mg/dL (ref 0.00–0.40)
Total Protein: 6.8 g/dL (ref 6.0–8.5)

## 2022-01-02 ENCOUNTER — Other Ambulatory Visit: Payer: Self-pay | Admitting: *Deleted

## 2022-01-02 DIAGNOSIS — B181 Chronic viral hepatitis B without delta-agent: Secondary | ICD-10-CM

## 2022-01-09 ENCOUNTER — Other Ambulatory Visit: Payer: Self-pay | Admitting: *Deleted

## 2022-01-09 DIAGNOSIS — B181 Chronic viral hepatitis B without delta-agent: Secondary | ICD-10-CM

## 2022-02-01 LAB — HEPATIC FUNCTION PANEL
ALT: 29 IU/L (ref 0–32)
AST: 23 IU/L (ref 0–40)
Albumin: 4.1 g/dL (ref 3.8–4.9)
Alkaline Phosphatase: 87 IU/L (ref 44–121)
Bilirubin Total: 0.5 mg/dL (ref 0.0–1.2)
Bilirubin, Direct: 0.15 mg/dL (ref 0.00–0.40)
Total Protein: 6.4 g/dL (ref 6.0–8.5)

## 2022-02-08 ENCOUNTER — Ambulatory Visit: Payer: Medicare Other | Admitting: Neurology

## 2022-02-08 ENCOUNTER — Encounter: Payer: Self-pay | Admitting: Neurology

## 2022-02-08 VITALS — BP 139/106 | HR 93 | Ht 66.0 in | Wt 180.0 lb

## 2022-02-08 DIAGNOSIS — G35 Multiple sclerosis: Secondary | ICD-10-CM | POA: Diagnosis not present

## 2022-02-08 DIAGNOSIS — B1910 Unspecified viral hepatitis B without hepatic coma: Secondary | ICD-10-CM | POA: Diagnosis not present

## 2022-02-08 DIAGNOSIS — Z79899 Other long term (current) drug therapy: Secondary | ICD-10-CM

## 2022-02-08 DIAGNOSIS — R7689 Other specified abnormal immunological findings in serum: Secondary | ICD-10-CM

## 2022-02-08 DIAGNOSIS — R768 Other specified abnormal immunological findings in serum: Secondary | ICD-10-CM | POA: Diagnosis not present

## 2022-02-08 DIAGNOSIS — R5383 Other fatigue: Secondary | ICD-10-CM

## 2022-02-08 DIAGNOSIS — R269 Unspecified abnormalities of gait and mobility: Secondary | ICD-10-CM

## 2022-02-08 DIAGNOSIS — G35D Multiple sclerosis, unspecified: Secondary | ICD-10-CM

## 2022-02-08 MED ORDER — DIMETHYL FUMARATE 240 MG PO CPDR
DELAYED_RELEASE_CAPSULE | ORAL | 11 refills | Status: DC
Start: 2022-02-08 — End: 2022-02-19

## 2022-02-08 MED ORDER — DIMETHYL FUMARATE 120 MG PO CPDR: DELAYED_RELEASE_CAPSULE | ORAL | 0 refills | Status: DC

## 2022-02-08 NOTE — Progress Notes (Signed)
Pt was too dehydrated to get labs completed today per [facility]. Pt will come back 02/12/22 in the am to get labs drawn.

## 2022-02-08 NOTE — Progress Notes (Signed)
[facility]  PATIENT: [name] DOB: 1970-10-25  REFERRING DOCTOR OR PCP:  Rochele Raring, MD; Brayton Layman, MD [facility] neurology) SOURCE: Patient, notes from [facility] neurology, imaging and laboratory reports, MRI images personally reviewed  _________________________________   HISTORICAL  CHIEF COMPLAINT:  Chief Complaint  Patient presents with   Follow-up    Rm 1 alone Pt is well, here to discuss other treatment options for MS     HISTORY OF PRESENT ILLNESS:  [name] is a 51 y.o. woman with  multiple sclerosis.    Update 02/08/2022 She has been on Ocrevus and her MS was stable.  Her last dose was in April and she was scheduled to have another dose in mid October.  However, when I saw her for the first time I checked labs including including hepatitis B.  Unfortunately, the hep B surface antigen was positive.  She was referred to GI.  They also checked the hep B DNA and it was positive.  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  Hepatology has recommended an antiviral.  She has not yet started.   Of note, in 2018 she had elevated liver function tests several times in a row (we do not have the actual labs) and she was felt to have the abnormality due to supplements.  Due to Hep B we need to switch to a different medication.   The majority of the visit was spent discussing her options.  Note, she initiated Copaxone after diagnosis but had breakthrough activity.  She had been on Tysabri for several years and tolerated it well but was concerned about the risk of PML.  However, she remained JCV antibody positive.  Because of that concern, she switched to [place] several years ago in 2018.  She notes no change in neurologic status compared to her last visit (see below)  From 10/03/2021 Currently, she is stable.    She notes that her stance is mildly wide and the gait is wide but she could walk one mile without support    She has some stumbles but  no falls.   Her right leg is more clumsy than the left and gives out on hr at times if more tired.    She denies numbness or tingling now but the right side will sometimes have mild paresthesias.   She feels bladder function is good.   Vision is slighly worse OD.   She needs reading glasses.  She reports fatigue.   She feels worse if she eats sweet or fatty foods.   She has sleep maintenance insomnia and wakes up a couple times a night and takes a while to fall back asleep.     She is on fluoxetine 60 mg for depression and anxiety.    Fluoxetine helps anxiety better than the depression.    She has reduced attention and is on methylphenidate with benefit..     MS History: She was diagnosed with MS in August 2009 after presenting with right sided numbness.   She was referred to Dr. Krista Blue and she had MRi's c/w MS.  She was placed on Copaxone.    She started to see Dr. Jacqulynn Cadet for a second opinion and was placed on Tysabri.   For a couple years, I saw her  in a different practice as out infusion center was closer to her.   When Ocrevus became available around 2018, she switched and continues on it.   She has tolerated  it well.    She did have one exacerbation in 2018 or 2019 and did a couple days of IV steroids.      Imaging: MRI of the brain 07/31/2021 showed no new lesions compared to the 06/14/2017 MRI.  Foci are noted in the cerebellum, right middle cerebellar peduncle anterior pons, left posterior midbrain, and in the periventricular, juxtacortical and deep white matter of the hemispheres.  None of the foci enhanced  MRI of the brain 06/14/2017 showed T2/FLAIR hyperintense foci in the cerebellum, right middle cerebellar peduncle, anterior pons, left posterior midbrain, and in the periventricular, juxtacortical and deep white matter of the hemispheres.  None of the foci enhanced or appear to be acute.  MRI of the cervical and thoracic spine 01/31/2008 showed a normal spinal cord.  There is an atypical  hemangioma within the T5 vertebral body.  REVIEW OF SYSTEMS: Constitutional: No fevers, chills, sweats, or change in appetite Eyes: No visual changes, double vision, eye pain Ear, nose and throat: No hearing loss, ear pain, nasal congestion, sore throat Cardiovascular: No chest pain, palpitations Respiratory:  No shortness of breath at rest or with exertion.   No wheezes GastrointestinaI: No nausea, vomiting, diarrhea, abdominal pain, fecal incontinence Genitourinary:  No dysuria, urinary retention or frequency.  No nocturia. Musculoskeletal:  No neck pain, back pain Integumentary: No rash, pruritus, skin lesions Neurological: as above Psychiatric: No depression at this time.  No anxiety Endocrine: No palpitations, diaphoresis, change in appetite, change in weigh or increased thirst Hematologic/Lymphatic:  No anemia, purpura, petechiae. Allergic/Immunologic: No itchy/runny eyes, nasal congestion, recent allergic reactions, rashes  ALLERGIES: Allergies  Allergen Reactions   Statins Hives   Zetia [Ezetimibe]     HOME MEDICATIONS:  Current Outpatient Medications:    aspirin 81 MG chewable tablet, Chew 1 tablet by mouth every other day. Bayer Brand, Disp: , Rfl:    cholecalciferol (VITAMIN D3) 25 MCG (1000 UNIT) tablet, Take 1,000 Units by mouth daily., Disp: , Rfl:    Dimethyl Fumarate (TECFIDERA) 120 MG CPDR, One po bid with meals, Disp: 14 capsule, Rfl: 0   Dimethyl Fumarate (TECFIDERA) 240 MG CPDR, One po bid after meals, Disp: 60 capsule, Rfl: 11   Evolocumab (REPATHA SURECLICK) 614 MG/ML SOAJ, Inject 1 Dose into the skin every 14 (fourteen) days., Disp: 6 mL, Rfl: 3   FLUoxetine HCl 60 MG TABS, Take 1 tablet by mouth daily., Disp: , Rfl:    methylphenidate (RITALIN) 20 MG tablet, Take 1 tablet (20 mg total) by mouth 2 (two) times daily as needed., Disp: 60 tablet, Rfl: 0   Multiple Vitamin (ONE DAILY MULTIVITAMIN ADULT) TABS, Take 1 tablet by mouth daily., Disp: , Rfl:     Omega-3 Fatty Acids (FISH OIL) 1000 MG CAPS, Take by mouth daily., Disp: , Rfl:    triamterene-hydrochlorothiazide (MAXZIDE-25) 37.5-25 MG per tablet, Take 1 tablet by mouth daily., Disp: , Rfl:    valACYclovir (VALTREX) 1000 MG tablet, Take 1,000 mg by mouth daily. (Patient not taking: Reported on 02/08/2022), Disp: , Rfl:    VEMLIDY 25 MG TABS, Take 1 tablet by mouth daily. (Patient not taking: Reported on 02/08/2022), Disp: , Rfl:   PAST MEDICAL HISTORY: Past Medical History:  Diagnosis Date   Anxiety    Chest pain    pressure in chest- ?related to MS- normal EKGs   Depression    Fatigue    related to MS   Headache    High cholesterol    Multiple sclerosis ([place])  Neuromuscular disorder ([place])    Multiple sclerosis   Shortness of breath dyspnea    on exertion- lack of exercise    Vaginal delivery 1999, 2003    PAST SURGICAL HISTORY: Past Surgical History:  Procedure Laterality Date   ABDOMINAL HYSTERECTOMY     BILATERAL SALPINGECTOMY Bilateral 02/09/2015   Procedure: BILATERAL SALPINGECTOMY;  Surgeon: Janyth Pupa, DO;  Location: [facility];  Service: Gynecology;  Laterality: Bilateral;   CHOLECYSTECTOMY     WISDOM TOOTH EXTRACTION      FAMILY HISTORY: Family History  Problem Relation Age of Onset   Breast cancer Mother    High Cholesterol Mother    High blood pressure Mother    High Cholesterol Father    High blood pressure Father    Glaucoma Father    Dementia Father        not fully dx yet   Aneurysm Maternal Grandmother    Glaucoma Paternal Grandmother     SOCIAL HISTORY:  Social History   Socioeconomic History   Marital status: Legally Separated    Spouse name: Not on file   Number of children: 2   Years of education: Not on file   Highest education level: Bachelor's degree (e.g., BA, AB, BS)  Occupational History   Not on file  Tobacco Use   Smoking status: Never   Smokeless tobacco: Never  Vaping Use   Vaping Use: Never used  Substance and Sexual  Activity   Alcohol use: Yes    Alcohol/week: 2.0 standard drinks of alcohol    Types: 2 Glasses of wine per week   Drug use: Never   Sexual activity: Not on file  Other Topics Concern   Not on file  Social History Narrative   Lives w daughter    R handed   Caffeine: varies, 4 servings in a week   Social Determinants of Health   Financial Resource Strain: Not on file  Food Insecurity: Not on file  Transportation Needs: Not on file  Physical Activity: Not on file  Stress: Not on file  Social Connections: Not on file  Intimate Partner Violence: Not on file     PHYSICAL EXAM (from 10/03/2021)  Vitals:   02/08/22 0858  BP: (!) 139/106  Pulse: 93  Weight: 180 lb (81.6 kg)  Height: '5\' 6"'$  (1.676 m)    Body mass index is 29.05 kg/m.  No results found.   General: The patient is well-developed and well-nourished and in no acute distress  HEENT:  Head is [place]/AT.  Sclera are anicteric.  Funduscopic exam shows normal optic discs and retinal vessels.  Neck: No carotid bruits are noted.  The neck is tender at the left occiput  Cardiovascular: The heart has a regular rate and rhythm with a normal S1 and S2. There were no murmurs, gallops or rubs.    Skin: Extremities are without rash or  edema.  Musculoskeletal:  Back is nontender  Neurologic Exam  Mental status: The patient is alert and oriented x 3 at the time of the examination. The patient has apparent normal recent and remote memory, with an apparently normal attention span and concentration ability.   Speech is normal.  Cranial nerves: Extraocular movements are full. Pupils are equal, round, and reactive to light and accomodation.  VNormal color vision.  Facial symmetry is present. There is good facial sensation to soft touch bilaterally.Facial strength is normal.  Trapezius and sternocleidomastoid strength is normal. No dysarthria is noted.  The tongue is  midline, and the patient has symmetric elevation of the soft palate.  No obvious hearing deficits are noted.  Motor:  Muscle bulk is normal.   Tone is normal. Strength is  5 / 5 in all 4 extremities.   Sensory: Sensory testing is intact to pinprick, soft touch and vibration sensation in the arms, mild reduced vibration sensation in the left leg.  Coordination: Cerebellar testing reveals good finger-nose-finger and heel-to-shin bilaterally.  Gait and station: Station is normal.   Gait is mildly wide and tandem is wide.   Romberg is negative.   Reflexes: Deep tendon reflexes are symmetric and normal bilaterally.         ASSESSMENT AND PLAN  Multiple sclerosis ([place]) - Plan: Stratify JCV Antibody Test ([facility]), CBC with Differential/Platelet, Hepatitis B DNA, Ultraquantitative, PCR, Hepatitis B surface antigen  Hepatitis B infection without delta agent without hepatic coma, unspecified chronicity - Plan: Hepatitis B DNA, Ultraquantitative, PCR, Hepatitis B surface antigen  High risk medication use - Plan: Stratify JCV Antibody Test ([facility]), CBC with Differential/Platelet, Hepatitis B DNA, Ultraquantitative, PCR, Hepatitis B surface antigen  Hepatitis B surface antigen positive  Gait disturbance  Other fatigue    Due to having hepatitis B, we need to stop the Ocrevus.  We spent the majority of the visit today discussing options.  She had been stable on Tysabri and tolerated it well.  She was concerned about the risk of PML.  I went over the risks.  If she remains JCV antibody negative the risk is very low (likely better than 1: 10,000) though the risk is obviously higher if she has JCV antibody positive status.  Another option is Tecfidera (or Vumerity) we discussed that there had been a few cases of PML with this medication though almost all of the patients who developed PML had very low lymphocyte counts for over a year. After giving more thought, she is most interested in [place] and I will send in a prescription.  As it is generic I think her local  pharmacy should be able to have it in stock but we will use specialty pharmacy if insurance prefers.  We discussed the most common side effects of GI upset and flushing.  It is recommended that she take the medicine after breakfast and after dinner.  She will start at 100 mg and go up to 240 mg twice a day after a week. Stay active and exercise as tolerated. Return in 6 months or sooner if there are new or worsening neurologic symptoms.  45-minute office visit with the majority of the time spent face-to-face for history, discussion/counseling and decision-making.  Additional time with record review and documentation.    Narayan Scull A. Felecia Shelling, MD, [facility] 11/11/7946, 0:16 PM Certified in Neurology, Clinical Neurophysiology, Sleep Medicine and Neuroimaging  [facility] [address], [place], [place] [address] [phone]

## 2022-02-12 ENCOUNTER — Telehealth: Payer: Self-pay | Admitting: Neurology

## 2022-02-12 ENCOUNTER — Other Ambulatory Visit (INDEPENDENT_AMBULATORY_CARE_PROVIDER_SITE_OTHER): Payer: Self-pay

## 2022-02-12 DIAGNOSIS — Z79899 Other long term (current) drug therapy: Secondary | ICD-10-CM | POA: Diagnosis not present

## 2022-02-12 DIAGNOSIS — Z0289 Encounter for other administrative examinations: Secondary | ICD-10-CM

## 2022-02-12 DIAGNOSIS — G35 Multiple sclerosis: Secondary | ICD-10-CM | POA: Diagnosis not present

## 2022-02-12 NOTE — Telephone Encounter (Signed)
Placed JCV lab in [facility] lock box for routine lab pick up. Results pending. 

## 2022-02-12 NOTE — Telephone Encounter (Signed)
Was called into the lab by nurse Cyril Mourning. Pt came in today to have lab work drawn and was stuck twice. On the second stick patient became light headed and began to look as if she was going to go out. She never fully lost consciousness. Her feet were propped in a chair. Initial BP was 123/81 and heart rate 109.   She was able to respond with eyes closed, answering questions. She was pale and diaphoretic. Pt was initially given water and goldfish to try and help. BP taken a 2nd time 119/82 and heart rate 86. I found grape juice in the infusion suite and she drank that. She called someone to come and pick her up. She is alert and states feeling better. BP 124/80 heart rate 94 10:30 was able to stand and get into wheelchair. Pt again states she is better and her color appears better. Pt was taken to the waiting room to wait on her ride to come pick her up.

## 2022-02-14 NOTE — Progress Notes (Signed)
Sent message via epic to Arlyss Queen at [facility] w/ [facility] that pt is to d/c Ocrevus and asked that 03/09/22 appt be cx.

## 2022-02-16 LAB — CBC WITH DIFFERENTIAL/PLATELET
Basophils Absolute: 0 10*3/uL (ref 0.0–0.2)
Basos: 1 %
EOS (ABSOLUTE): 0.1 10*3/uL (ref 0.0–0.4)
Eos: 2 %
Hematocrit: 43.7 % (ref 34.0–46.6)
Hemoglobin: 14.4 g/dL (ref 11.1–15.9)
Immature Grans (Abs): 0 10*3/uL (ref 0.0–0.1)
Immature Granulocytes: 0 %
Lymphocytes Absolute: 1.8 10*3/uL (ref 0.7–3.1)
Lymphs: 36 %
MCH: 29 pg (ref 26.6–33.0)
MCHC: 33 g/dL (ref 31.5–35.7)
MCV: 88 fL (ref 79–97)
Monocytes Absolute: 0.4 10*3/uL (ref 0.1–0.9)
Monocytes: 8 %
Neutrophils Absolute: 2.6 10*3/uL (ref 1.4–7.0)
Neutrophils: 53 %
Platelets: 261 10*3/uL (ref 150–450)
RBC: 4.96 x10E6/uL (ref 3.77–5.28)
RDW: 13.1 % (ref 11.7–15.4)
WBC: 4.9 10*3/uL (ref 3.4–10.8)

## 2022-02-16 LAB — HEPATITIS B SURFACE ANTIGEN

## 2022-02-16 LAB — HEPATITIS B SURFACE AG, CONFIRM: HBsAG Confirmation: POSITIVE — AB

## 2022-02-16 LAB — HEPATITIS B DNA, ULTRAQUANTITATIVE, PCR: HBV DNA SERPL PCR-ACNC: >1000000000 IU/mL

## 2022-02-19 ENCOUNTER — Other Ambulatory Visit: Payer: Self-pay

## 2022-02-19 MED ORDER — DIMETHYL FUMARATE 120 MG PO CPDR
DELAYED_RELEASE_CAPSULE | ORAL | 0 refills | Status: DC
Start: 2022-02-19 — End: 2022-03-05

## 2022-02-19 MED ORDER — DIMETHYL FUMARATE 240 MG PO CPDR: DELAYED_RELEASE_CAPSULE | ORAL | 11 refills | Status: DC

## 2022-02-19 NOTE — Telephone Encounter (Signed)
I called [facility]. They are unable to fill dimethyl fumarate since it is a specialty drug and it should be sent to a specialty pharmacy.  I have sent the RX to [facility].

## 2022-02-19 NOTE — Telephone Encounter (Signed)
JCV ab drawn 02/12/22 negative, index: 0.15.

## 2022-02-26 MED ORDER — METHYLPHENIDATE HCL 20 MG PO TABS: 20.0000 mg | ORAL_TABLET | Freq: Two times a day (BID) | ORAL | 0 refills | Status: DC | PRN

## 2022-02-26 NOTE — Telephone Encounter (Signed)
I called patient. She has not gotten calls from dimethyl fumarate yet from [facility] but will check her VMs to make sure.   She asked for a refill on ritalin and I will send this to Dr. Felecia Shelling for review. Patient is up to date on her appointments and is due for a refill on ritalin. [place] Controlled Substance Registry checked and is appropriate.

## 2022-02-27 ENCOUNTER — Ambulatory Visit (HOSPITAL_COMMUNITY)
Admission: RE | Admit: 2022-02-27 | Discharge: 2022-02-27 | Disposition: A | Discharge status: Home / Self Care | Payer: Medicare Other | Attending: Psychiatry | Admitting: Psychiatry
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
## 2022-03-05 ENCOUNTER — Encounter: Payer: Self-pay | Admitting: Internal Medicine

## 2022-03-05 ENCOUNTER — Other Ambulatory Visit: Payer: Self-pay

## 2022-03-05 MED ORDER — DIMETHYL FUMARATE 240 MG PO CPDR: DELAYED_RELEASE_CAPSULE | ORAL | 11 refills | Status: DC

## 2022-03-05 MED ORDER — DIMETHYL FUMARATE 120 MG PO CPDR
DELAYED_RELEASE_CAPSULE | ORAL | 0 refills | Status: DC
Start: 2022-03-05 — End: 2022-03-27

## 2022-03-05 NOTE — Telephone Encounter (Signed)
I called patient. She denies getting any calls from [facility] regarding her dimethyl fumarate.  I called [facility]. They have made multiple calls and emails to the patient but she has not answered nor returned their calls. They will try again now.

## 2022-03-07 ENCOUNTER — Ambulatory Visit: Payer: Medicare Other | Attending: Cardiology | Admitting: *Deleted

## 2022-03-07 DIAGNOSIS — E7849 Other hyperlipidemia: Secondary | ICD-10-CM

## 2022-03-07 NOTE — Progress Notes (Signed)
   Nurse Visit   Date of Encounter: 03/07/2022 ID: [name], DOB [birth year], MRN [id number]  PCP:  Britt Bottom, MD   [place] Providers Cardiologist:  None      Visit Details   VS:  There were no vitals taken for this visit. , BMI There is no height or weight on file to calculate BMI.  Wt Readings from Last 3 Encounters:  02/08/22 180 lb (81.6 kg)  12/06/21 191 lb 12.8 oz (87 kg)  10/03/21 197 lb (89.4 kg)     Reason for visit: repatha injection/education Performed today: education on repatha injection, monitoring of patient injection Changes (medications, testing, etc.) : n/a Length of Visit: 10 minutes Provided phone # for Repatha Ready to request replacement pen and lab slips to be done prior to 11/10 visit    Medications Adjustments/Labs and Tests Ordered: No orders of the defined types were placed in this encounter.  No orders of the defined types were placed in this encounter.    Signed, Fidel Levy, RN  03/07/2022 2:45 PM

## 2022-03-09 ENCOUNTER — Inpatient Hospital Stay (HOSPITAL_COMMUNITY)
Admission: RE | Admit: 2022-03-09 | Discharge: 2022-03-09 | Disposition: A | Discharge status: Home / Self Care | Payer: Medicare Other | Source: Ambulatory Visit

## 2022-03-27 ENCOUNTER — Telehealth (INDEPENDENT_AMBULATORY_CARE_PROVIDER_SITE_OTHER): Payer: Medicare Other | Admitting: Neurology

## 2022-03-27 ENCOUNTER — Encounter: Payer: Self-pay | Admitting: Neurology

## 2022-03-27 DIAGNOSIS — Z79899 Other long term (current) drug therapy: Secondary | ICD-10-CM

## 2022-03-27 DIAGNOSIS — R269 Unspecified abnormalities of gait and mobility: Secondary | ICD-10-CM

## 2022-03-27 DIAGNOSIS — F988 Other specified behavioral and emotional disorders with onset usually occurring in childhood and adolescence: Secondary | ICD-10-CM

## 2022-03-27 DIAGNOSIS — G35 Multiple sclerosis: Secondary | ICD-10-CM

## 2022-03-27 DIAGNOSIS — F418 Other specified anxiety disorders: Secondary | ICD-10-CM

## 2022-03-27 DIAGNOSIS — R5383 Other fatigue: Secondary | ICD-10-CM

## 2022-03-27 DIAGNOSIS — B1910 Unspecified viral hepatitis B without hepatic coma: Secondary | ICD-10-CM | POA: Diagnosis not present

## 2022-03-27 DIAGNOSIS — G4700 Insomnia, unspecified: Secondary | ICD-10-CM

## 2022-03-27 NOTE — Progress Notes (Signed)
[facility]  PATIENT: [name] DOB: 11-Jun-1970  REFERRING DOCTOR OR PCP:  Rochele Raring, MD; Brayton Layman, MD [facility] neurology) SOURCE: Patient, notes from [facility] neurology, imaging and laboratory reports, MRI images personally reviewed  _________________________________   HISTORICAL  CHIEF COMPLAINT:  Chief Complaint  Patient presents with   Multiple Sclerosis    HISTORY OF PRESENT ILLNESS:  [name] is a 51 y.o. woman with  multiple sclerosis.   Virtual Visit via Video Note I connected with Rudell Cobb  on 03/27/22 at  3:00 PM EDT by a video enabled telemedicine application and verified that I am speaking with the correct person.  I discussed the limitations of evaluation and management by telemedicine and the availability of in person appointments. The patient expressed understanding and agreed to proceed.   Patient at home and provider in the office  Update 03/27/2022 Due to being positive for hepatitis, I stop the Ocrevus.  She was referred to GI and is currently on antiviral treatment.  We had started Tecfidera but had itching (no rash).   Therefore she stopped.   She had been on Copaxone initially and tolerated it well.  She is not interested in Tysabri  Currently, she is stable.    Gait and balance are stable.   She is starting to walk more now that the temperatures are better.     Her right leg is clumsy but not weak    She denies numbness or tingling now but the right side will sometimes have mild paresthesias.   She feels bladder function is good.   Vision is stable.  She reports fatigue.   Ritalin qAM has helped.   She has sleep maintenance insomnia and wakes up a couple times most nights but is falling back asleep.     She has depression and anxiety.    Fluoxetine helps anxiety better than the depression.    She has reduced attention and is on methylphenidate with benefit..       MS History: She was diagnosed with MS in  August 2009 after presenting with right sided numbness.   She was referred to Dr. Krista Blue and she had MRi's c/w MS.  She was placed on Copaxone.    She started to see Dr. Jacqulynn Cadet for a second opinion and was placed on Tysabri.   For a couple years, I saw her  in a different practice as out infusion center was closer to her.   When Ocrevus became available around 2018, she switched and continues on it.   She has tolerated it well.    She did have one exacerbation in 2018 or 2019 and did a couple days of IV steroids.     Imaging: MRI of the brain 07/31/2021 showed no new lesions compared to the 06/14/2017 MRI.  Foci are noted in the cerebellum, right middle cerebellar peduncle anterior pons, left posterior midbrain, and in the periventricular, juxtacortical and deep white matter of the hemispheres.  None of the foci enhanced  MRI of the brain 06/14/2017 showed T2/FLAIR hyperintense foci in the cerebellum, right middle cerebellar peduncle, anterior pons, left posterior midbrain, and in the periventricular, juxtacortical and deep white matter of the hemispheres.  None of the foci enhanced or appear to be acute.  MRI of the cervical and thoracic spine 01/31/2008 showed a normal spinal cord.  There is an atypical hemangioma within the T5 vertebral body.  REVIEW OF SYSTEMS: Constitutional: No fevers, chills, sweats, or  change in appetite Eyes: No visual changes, double vision, eye pain Ear, nose and throat: No hearing loss, ear pain, nasal congestion, sore throat Cardiovascular: No chest pain, palpitations Respiratory:  No shortness of breath at rest or with exertion.   No wheezes GastrointestinaI: No nausea, vomiting, diarrhea, abdominal pain, fecal incontinence Genitourinary:  No dysuria, urinary retention or frequency.  No nocturia. Musculoskeletal:  No neck pain, back pain Integumentary: No rash, pruritus, skin lesions Neurological: as above Psychiatric: No depression at this time.  No anxiety Endocrine:  No palpitations, diaphoresis, change in appetite, change in weigh or increased thirst Hematologic/Lymphatic:  No anemia, purpura, petechiae. Allergic/Immunologic: No itchy/runny eyes, nasal congestion, recent allergic reactions, rashes  ALLERGIES: Allergies  Allergen Reactions   Statins Hives   Zetia [Ezetimibe]     HOME MEDICATIONS:  Current Outpatient Medications:    aspirin 81 MG chewable tablet, Chew 1 tablet by mouth every other day. Bayer Brand, Disp: , Rfl:    cholecalciferol (VITAMIN D3) 25 MCG (1000 UNIT) tablet, Take 1,000 Units by mouth daily., Disp: , Rfl:    Evolocumab (REPATHA SURECLICK) 932 MG/ML SOAJ, Inject 1 Dose into the skin every 14 (fourteen) days., Disp: 6 mL, Rfl: 3   FLUoxetine HCl 60 MG TABS, Take 1 tablet by mouth daily., Disp: , Rfl:    methylphenidate (RITALIN) 20 MG tablet, Take 1 tablet (20 mg total) by mouth 2 (two) times daily as needed., Disp: 60 tablet, Rfl: 0   Multiple Vitamin (ONE DAILY MULTIVITAMIN ADULT) TABS, Take 1 tablet by mouth daily., Disp: , Rfl:    Omega-3 Fatty Acids (FISH OIL) 1000 MG CAPS, Take by mouth daily., Disp: , Rfl:    triamterene-hydrochlorothiazide (MAXZIDE-25) 37.5-25 MG per tablet, Take 1 tablet by mouth daily., Disp: , Rfl:    valACYclovir (VALTREX) 1000 MG tablet, Take 1,000 mg by mouth daily. (Patient not taking: Reported on 02/08/2022), Disp: , Rfl:    VEMLIDY 25 MG TABS, Take 1 tablet by mouth daily. (Patient not taking: Reported on 02/08/2022), Disp: , Rfl:   PAST MEDICAL HISTORY: Past Medical History:  Diagnosis Date   Anxiety    Chest pain    pressure in chest- ?related to MS- normal EKGs   Depression    Fatigue    related to MS   Headache    High cholesterol    Multiple sclerosis (HCC)    Neuromuscular disorder (HCC)    Multiple sclerosis   Shortness of breath dyspnea    on exertion- lack of exercise    Vaginal delivery 1999, 2003    PAST SURGICAL HISTORY: Past Surgical History:  Procedure Laterality  Date   ABDOMINAL HYSTERECTOMY     BILATERAL SALPINGECTOMY Bilateral 02/09/2015   Procedure: BILATERAL SALPINGECTOMY;  Surgeon: Janyth Pupa, DO;  Location: [facility];  Service: Gynecology;  Laterality: Bilateral;   CHOLECYSTECTOMY     WISDOM TOOTH EXTRACTION      FAMILY HISTORY: Family History  Problem Relation Age of Onset   Breast cancer Mother    High Cholesterol Mother    High blood pressure Mother    High Cholesterol Father    High blood pressure Father    Glaucoma Father    Dementia Father        not fully dx yet   Aneurysm Maternal Grandmother    Glaucoma Paternal Grandmother     SOCIAL HISTORY:  Social History   Socioeconomic History   Marital status: Legally Separated    Spouse name: Not on file  Number of children: 2   Years of education: Not on file   Highest education level: Bachelor's degree (e.g., BA, AB, BS)  Occupational History   Not on file  Tobacco Use   Smoking status: Never   Smokeless tobacco: Never  Vaping Use   Vaping Use: Never used  Substance and Sexual Activity   Alcohol use: Yes    Alcohol/week: 2.0 standard drinks of alcohol    Types: 2 Glasses of wine per week   Drug use: Never   Sexual activity: Not on file  Other Topics Concern   Not on file  Social History Narrative   Lives w daughter    R handed   Caffeine: varies, 4 servings in a week   Social Determinants of Health   Financial Resource Strain: Not on file  Food Insecurity: Not on file  Transportation Needs: Not on file  Physical Activity: Not on file  Stress: Not on file  Social Connections: Not on file  Intimate Partner Violence: Not on file    Physical exam She is a well-developed well-nourished woman in no acute distress.  The head is normocephalic and atraumatic.  Sclera are anicteric.  Visible skin appears normal.  The neck has a good range of motion.   She is alert and fully oriented with fluent speech and good attention, knowledge and memory.  Extraocular muscles  are intact.  Facial strength is normal.   She appears to have normal strength in the arms.  Rapid alternating movements and finger-nose-finger are performed well.    PHYSICAL EXAM (from 10/03/2021)  There were no vitals filed for this visit.   There is no height or weight on file to calculate BMI.  No results found.   General: The patient is well-developed and well-nourished and in no acute distress  HEENT:  Head is [place]/AT.  Sclera are anicteric.  Funduscopic exam shows normal optic discs and retinal vessels.  Neck: No carotid bruits are noted.  The neck is tender at the left occiput  Cardiovascular: The heart has a regular rate and rhythm with a normal S1 and S2. There were no murmurs, gallops or rubs.    Skin: Extremities are without rash or  edema.  Musculoskeletal:  Back is nontender  Neurologic Exam  Mental status: The patient is alert and oriented x 3 at the time of the examination. The patient has apparent normal recent and remote memory, with an apparently normal attention span and concentration ability.   Speech is normal.  Cranial nerves: Extraocular movements are full. Pupils are equal, round, and reactive to light and accomodation.  VNormal color vision.  Facial symmetry is present. There is good facial sensation to soft touch bilaterally.Facial strength is normal.  Trapezius and sternocleidomastoid strength is normal. No dysarthria is noted.  The tongue is midline, and the patient has symmetric elevation of the soft palate. No obvious hearing deficits are noted.  Motor:  Muscle bulk is normal.   Tone is normal. Strength is  5 / 5 in all 4 extremities.   Sensory: Sensory testing is intact to pinprick, soft touch and vibration sensation in the arms, mild reduced vibration sensation in the left leg.  Coordination: Cerebellar testing reveals good finger-nose-finger and heel-to-shin bilaterally.  Gait and station: Station is normal.   Gait is mildly wide and tandem is wide.    Romberg is negative.   Reflexes: Deep tendon reflexes are symmetric and normal bilaterally.         ASSESSMENT  AND PLAN  Multiple sclerosis (HCC)  Hepatitis B infection without delta agent without hepatic coma, unspecified chronicity  High risk medication use  Gait disturbance  Other fatigue  Depression with anxiety  Insomnia, unspecified type  Attention deficit disorder (ADD) in adult   She needed to be switched off Ocrevus due to being positive for hepatitis B.  Unfortunately, she was unable to tolerate Tecfidera.  We discussed options and she is most interested in getting back on Copaxone that she had been on initially shortly after diagnosis.  We discussed that if she has breakthrough activity we would need to consider a stronger medication.  Stay active and exercise as tolerated. Return in 6 months or sooner if there are new or worsening neurologic symptoms.   Follow Up Instructions: I discussed the assessment and treatment plan with the patient. The patient was provided an opportunity to ask questions and all were answered. The patient agreed with the plan and demonstrated an understanding of the instructions.    The patient was advised to call back or seek an in-person evaluation if the symptoms worsen or if the condition fails to improve as anticipated.  I provided 26 minutes of non-face-to-face time during this encounter.  Shyquan Stallbaumer A. Felecia Shelling, MD, [facility] 67/06/4101, 0:13 PM Certified in Neurology, Clinical Neurophysiology, Sleep Medicine and Neuroimaging  [facility] [address] [place], [place] [address] [phone]

## 2022-04-02 ENCOUNTER — Telehealth: Payer: Self-pay

## 2022-04-02 NOTE — Telephone Encounter (Signed)
Faxed glatiramer start form to [employer]. Received a receipt of confirmation.  Initiated PA for glatiramer on CMM. Sent to [place]. Received this notice: "This medication or product is on your plan's list of covered drugs. Prior authorization is not required at this time. If your pharmacy has questions regarding the processing of your prescription, please have them call the [facility] pharmacy help desk at [phone]. **Please note: This request was submitted electronically. Formulary lowering, tiering exception, cost reduction and/or pre-benefit determination review (including prospective Medicare hospice reviews) requests cannot be requested using this method of submission. Providers contact us at [phone] for further assistance."

## 2022-04-06 DIAGNOSIS — E7849 Other hyperlipidemia: Secondary | ICD-10-CM | POA: Diagnosis not present

## 2022-04-08 LAB — NMR, LIPOPROFILE
Cholesterol, Total: 196 mg/dL (ref 100–199)
HDL Particle Number: 33.1 umol/L (ref >=30.5)
HDL-C: 64 mg/dL (ref >39)
LDL Particle Number: 1203 nmol/L — ABNORMAL HIGH (ref <1000)
LDL Size: 21.6 nm (ref >20.5)
LDL-C (NIH Calc): 118 mg/dL — ABNORMAL HIGH (ref 0–99)
LP-IR Score: <25 (ref <=45)
Small LDL Particle Number: 259 nmol/L (ref <=527)
Triglycerides: 80 mg/dL (ref 0–149)

## 2022-04-08 LAB — LIPOPROTEIN A (LPA): Lipoprotein (a): 206.7 nmol/L — ABNORMAL HIGH (ref <75.0)

## 2022-04-13 ENCOUNTER — Encounter: Payer: Self-pay | Admitting: Internal Medicine

## 2022-04-13 ENCOUNTER — Ambulatory Visit: Payer: Medicare Other | Attending: Internal Medicine | Admitting: Internal Medicine

## 2022-04-13 VITALS — BP 138/88 | HR 95 | Ht 66.0 in | Wt 178.4 lb

## 2022-04-13 DIAGNOSIS — E7849 Other hyperlipidemia: Secondary | ICD-10-CM | POA: Diagnosis not present

## 2022-04-13 DIAGNOSIS — Z888 Allergy status to other drugs, medicaments and biological substances status: Secondary | ICD-10-CM

## 2022-04-13 DIAGNOSIS — E7841 Elevated Lipoprotein(a): Secondary | ICD-10-CM | POA: Diagnosis not present

## 2022-04-13 NOTE — Progress Notes (Signed)
LIPID CLINIC CONSULT NOTE  Chief Complaint:  Follow-up dyslipidemia  Primary Care Physician: Britt Bottom, MD  Primary Cardiologist:  None  HPI:  [name] is a 51 y.o. female who is being seen today for the evaluation of dyslipidemia at the request of Charlane Ferretti, MD. This is a pleasant 51 year old female kindly referred for evaluation management of dyslipidemia.  She reports longstanding history of high cholesterol back into her 5s.  She is felt that her diet has been not ideal although there is some heart disease in the family.  In the past she had been tried on statins but had a significant whole body rash and was thought to be allergic.  She had also apparently tried ezetimibe but was taken off of that due to intolerance.  More recently she has been on no therapy for her cholesterol.  She did have lab work in March 2023 which showed total cholesterol 275, HDL 54, triglycerides 102 and LDL 203.  In addition in April she underwent coronary artery calcium scoring.  The calcium score is elevated showing a calcium score of 56, 96th percentile for age and sex matched controls.  Based on that she was referred for additional therapy.  Of note, she has a diagnosis of multiple sclerosis on Ocrevus (anti-CD20 antibody therapy) -it is recommended that she have surveillance viral testing on this medication and she did recently obtain a diagnosis of chronic hepatitis B.  She was referred to see Roosevelt Locks, NP and plans to start a suppressive antiviral therapy with Vemlidy.  She reports her diet does have a number of fatty or fried foods and we talked about the importance of reducing saturated fats and increasing exercise.  04/13/2022  [name] returns today for follow-up.  She has been using Repatha except for about 1 month where she had some issue with it.  She has been injecting in her arms, abdomen and thighs.  She had an issue in the left arm possibly related to an injection with what she  described as "drawing" left arm pain without associated chest pain.  It sounds like she has at least had 2 doses +2 weeks prior to recent labs.  She has had significant reduction in her lipids.  Her LP(a) showed an LDL particle #1203, LDL-C of 118 (down from 203), HDL 64, triglycerides 80 and small LDL particle number is low at 259.  Not surprisingly her LP(a) is elevated at 206.7 nmol/L, probably 30% lower than it was prior to starting on Repatha.  There are no other significant options for lowering at this time as this is genetically determined and not affected by diet, lifestyle or activity.  This is a significant risk factor for early onset heart disease and may explain her elevated calcium score.  PMHx:  Past Medical History:  Diagnosis Date   Anxiety    Chest pain    pressure in chest- ?related to MS- normal EKGs   Depression    Fatigue    related to MS   Headache    High cholesterol    Multiple sclerosis (HCC)    Neuromuscular disorder (HCC)    Multiple sclerosis   Shortness of breath dyspnea    on exertion- lack of exercise    Vaginal delivery 1999, 2003    Past Surgical History:  Procedure Laterality Date   ABDOMINAL HYSTERECTOMY     BILATERAL SALPINGECTOMY Bilateral 02/09/2015   Procedure: BILATERAL SALPINGECTOMY;  Surgeon: Janyth Pupa, DO;  Location:  [facility];  Service: Gynecology;  Laterality: Bilateral;   CHOLECYSTECTOMY     WISDOM TOOTH EXTRACTION      FAMHx:  Family History  Problem Relation Age of Onset   Breast cancer Mother    High Cholesterol Mother    High blood pressure Mother    High Cholesterol Father    High blood pressure Father    Glaucoma Father    Dementia Father        not fully dx yet   Aneurysm Maternal Grandmother    Glaucoma Paternal Grandmother     SOCHx:   reports that she has never smoked. She has never used smokeless tobacco. She reports current alcohol use of about 2.0 standard drinks of alcohol per week. She reports that she does not  use drugs.  ALLERGIES:  Allergies  Allergen Reactions   Statins Hives   Zetia [Ezetimibe]     ROS: Pertinent items noted in HPI and remainder of comprehensive ROS otherwise negative.  HOME MEDS: Current Outpatient Medications on File Prior to Visit  Medication Sig Dispense Refill   aspirin 81 MG chewable tablet Chew 1 tablet by mouth every other day. Bayer Brand     cholecalciferol (VITAMIN D3) 25 MCG (1000 UNIT) tablet Take 1,000 Units by mouth daily.     Evolocumab (REPATHA SURECLICK) 353 MG/ML SOAJ Inject 1 Dose into the skin every 14 (fourteen) days. 6 mL 3   FLUoxetine HCl 60 MG TABS Take 1 tablet by mouth daily.     methylphenidate (RITALIN) 20 MG tablet Take 1 tablet (20 mg total) by mouth 2 (two) times daily as needed. 60 tablet 0   Multiple Vitamin (ONE DAILY MULTIVITAMIN ADULT) TABS Take 1 tablet by mouth daily.     Omega-3 Fatty Acids (FISH OIL) 1000 MG CAPS Take by mouth daily.     triamterene-hydrochlorothiazide (MAXZIDE-25) 37.5-25 MG per tablet Take 1 tablet by mouth daily.     valACYclovir (VALTREX) 1000 MG tablet Take 1,000 mg by mouth daily.     VEMLIDY 25 MG TABS Take 1 tablet by mouth daily.     No current facility-administered medications on file prior to visit.    LABS/IMAGING: No results found for this or any previous visit (from the past 48 hour(s)). No results found.  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   [name] has had a significant improvement in her lipids, but not quite about 50% reduction in her LDL.  This is likely due to high LP(a).  Outside of a clinical trial for which she would not qualify at this time she is on the best therapy we can provide at this point.  Her LDL still remains a little higher with LDL particle numbers that  could be improved somewhat.  Because it was unclear whether she had had full course of treatment with her PCSK9 inhibitor prior to labs, we will plan to continue with this therapy and repeat a lipid NMR in 6 months.  We could conceivably add Nexletol as another option to try to lower her LDL further although this would not affect her LP(a).  Pixie Casino, MD, [facility], [place] Director of the Advanced Lipid Disorders &  Cardiovascular Risk Reduction Clinic Diplomate of the American Board of Clinical Lipidology Attending Cardiologist  Direct Dial: [phone]  Fax: [phone]  Website:  [email or web address] 04/13/2022, 9:23 AM

## 2022-04-13 NOTE — Patient Instructions (Signed)
Medication Instructions:  Your physician recommends that you continue on your current medications as directed. Please refer to the Current Medication list given to you today.   Labwork: NMR- 6 months- prior to follow up  Testing/Procedures: None  Follow-Up: Follow up with Dr. Debara Pickett in 6 months.   Any Other Special Instructions Will Be Listed Below (If Applicable).     If you need a refill on your cardiac medications before your next appointment, please call your pharmacy.

## 2022-04-16 NOTE — Telephone Encounter (Signed)
I called [facility] to check on patient's glatiramer prescription.  It has been sent to [place].  Per [employer], they spoke with patient on November 10 regarding locating funding for the glatiramer.  They are scheduled to call her again on November 14.  It is unclear if she has a high co-pay or not.  Patient should call [facility] at [phone] to discuss her glatiramer further.

## 2022-04-17 NOTE — Telephone Encounter (Signed)
From [facility]: "Patient Assistance Department (PAD) has already been notified to the patient asking for financial assistance with the $1203.05 copay. The patient is ineligible for a copay card, so grants and vouchers are the next option. Once the patient is screened for these, if none match, she will be referred to the manufacturer for assistance. Should the manufacturer also deny the patient, she can be referred to our [facility] program.   The drug was covered partially by the patients insurance. Every plan is different, and covers different amounts for the patient depending on a multitude of factors. The copay may not stay steady through a plan year.   PAD is always ready to assist with these costs if they can. The patient is currently in queue to be screened, and unfortunately there is no turn-around time for this process"

## 2022-04-23 NOTE — Telephone Encounter (Signed)
Per [facility]: "Upon review it looks like the [employer] App was mailed to patient and they received yesterday. Patient assistance will monitor for when the Application is mailed back and then reach out to patient with next steps."

## 2022-04-25 ENCOUNTER — Ambulatory Visit: Payer: Medicare Other | Admitting: Neurology

## 2022-05-01 ENCOUNTER — Other Ambulatory Visit: Payer: Self-pay | Admitting: Neurology

## 2022-05-02 MED ORDER — METHYLPHENIDATE HCL 20 MG PO TABS
20.0000 mg | ORAL_TABLET | Freq: Two times a day (BID) | ORAL | 0 refills | Status: DC | PRN
Start: 2022-05-02 — End: 2022-07-01

## 2022-05-02 NOTE — Telephone Encounter (Signed)
Pt last seen 03/27/22. Per drug registry, last refilled 02/26/22 #60.

## 2022-05-14 ENCOUNTER — Other Ambulatory Visit: Payer: Self-pay | Admitting: Nurse Practitioner

## 2022-05-14 DIAGNOSIS — M359 Systemic involvement of connective tissue, unspecified: Secondary | ICD-10-CM | POA: Diagnosis not present

## 2022-05-14 DIAGNOSIS — B181 Chronic viral hepatitis B without delta-agent: Secondary | ICD-10-CM

## 2022-05-14 DIAGNOSIS — R748 Abnormal levels of other serum enzymes: Secondary | ICD-10-CM | POA: Diagnosis not present

## 2022-05-30 ENCOUNTER — Ambulatory Visit
Admission: RE | Admit: 2022-05-30 | Discharge: 2022-05-30 | Disposition: A | Discharge status: Home / Self Care | Payer: Medicare Other | Source: Ambulatory Visit | Attending: Nurse Practitioner | Admitting: Nurse Practitioner

## 2022-05-30 DIAGNOSIS — Z9049 Acquired absence of other specified parts of digestive tract: Secondary | ICD-10-CM | POA: Diagnosis not present

## 2022-05-30 DIAGNOSIS — B181 Chronic viral hepatitis B without delta-agent: Secondary | ICD-10-CM

## 2022-06-01 DIAGNOSIS — R748 Abnormal levels of other serum enzymes: Secondary | ICD-10-CM | POA: Diagnosis not present

## 2022-06-01 DIAGNOSIS — M359 Systemic involvement of connective tissue, unspecified: Secondary | ICD-10-CM | POA: Diagnosis not present

## 2022-06-21 ENCOUNTER — Telehealth: Payer: Self-pay | Admitting: Internal Medicine

## 2022-06-21 NOTE — Telephone Encounter (Signed)
PA request received in [facility] Message received:  This medication or product was previously approved on [id number] from 2022-06-04 to 2023-06-04.

## 2022-07-01 ENCOUNTER — Other Ambulatory Visit: Payer: Self-pay | Admitting: Neurology

## 2022-07-02 MED ORDER — METHYLPHENIDATE HCL 20 MG PO TABS: 20.0000 mg | ORAL_TABLET | Freq: Two times a day (BID) | ORAL | 0 refills | Status: DC | PRN

## 2022-07-20 DIAGNOSIS — R748 Abnormal levels of other serum enzymes: Secondary | ICD-10-CM | POA: Diagnosis not present

## 2022-07-25 DIAGNOSIS — H53461 Homonymous bilateral field defects, right side: Secondary | ICD-10-CM | POA: Diagnosis not present

## 2022-07-25 DIAGNOSIS — H40013 Open angle with borderline findings, low risk, bilateral: Secondary | ICD-10-CM | POA: Diagnosis not present

## 2022-07-25 DIAGNOSIS — D3131 Benign neoplasm of right choroid: Secondary | ICD-10-CM | POA: Diagnosis not present

## 2022-08-20 ENCOUNTER — Ambulatory Visit: Payer: Medicare Other | Admitting: Neurology

## 2022-08-20 ENCOUNTER — Encounter: Payer: Self-pay | Admitting: Neurology

## 2022-08-20 VITALS — BP 134/84 | HR 86 | Ht 66.0 in | Wt 182.0 lb

## 2022-08-20 DIAGNOSIS — Z1231 Encounter for screening mammogram for malignant neoplasm of breast: Secondary | ICD-10-CM | POA: Diagnosis not present

## 2022-08-20 DIAGNOSIS — B1910 Unspecified viral hepatitis B without hepatic coma: Secondary | ICD-10-CM | POA: Diagnosis not present

## 2022-08-20 DIAGNOSIS — R269 Unspecified abnormalities of gait and mobility: Secondary | ICD-10-CM | POA: Diagnosis not present

## 2022-08-20 DIAGNOSIS — G35 Multiple sclerosis: Secondary | ICD-10-CM

## 2022-08-20 DIAGNOSIS — Z79899 Other long term (current) drug therapy: Secondary | ICD-10-CM | POA: Diagnosis not present

## 2022-08-20 DIAGNOSIS — R5383 Other fatigue: Secondary | ICD-10-CM

## 2022-08-20 DIAGNOSIS — F418 Other specified anxiety disorders: Secondary | ICD-10-CM

## 2022-08-20 MED ORDER — METHYLPHENIDATE HCL 20 MG PO TABS: 20.0000 mg | ORAL_TABLET | Freq: Two times a day (BID) | ORAL | 0 refills | Status: AC | PRN

## 2022-08-20 NOTE — Progress Notes (Signed)
[facility]  PATIENT: [name] DOB: August 04, 1970  REFERRING DOCTOR OR PCP:  Rochele Raring, MD; Brayton Layman, MD [facility] neurology) SOURCE: Patient, notes from [facility] neurology, imaging and laboratory reports, MRI images personally reviewed  _________________________________   HISTORICAL  CHIEF COMPLAINT:  Chief Complaint  Patient presents with   Follow-up    RM 10,alone. MS DMT: not on any therapy currently.  Never got on glatiramer acetate.     HISTORY OF PRESENT ILLNESS:  [name] is a 52 y.o. woman with  multiple sclerosis.    Update 08/20/2022 Her last Ocrevus was in April and she was scheduled to have another dose in mid October.  Her first exacerbation was in 2009 leading to diagnosis  She had another exacerbation in 04/2015 and she had 5 days IV Solu-Medrol.   Her symptoms were slurred speech.      She has not taken the medications for Hep B. Her last HBV DNA was markedly elevated.     She has no known risks.   Hepatology has recommended an antiviral.  She has not yet started.   Of note, in 2018 she had elevated liver function tests several times in a row (we do not have the actual labs) and she was felt to have the abnormality due to supplements.   Due to Hep B we need to switch to a different medication.  She initiated Copaxone after diagnosis  She tolerated it well   She had been on Tysabri for several years and tolerated it well but was concerned about the risk of PML.  Most recent JCV test (2023) is negative.  Because of that concern, she switched to [place] several years ago in 2018.  Currently, she is stable.    Gait and balance are stable.   She is starting to walk more now that the temperatures are better.     Her right leg is clumsy but not weak    She denies numbness or tingling now but the right side will sometimes have mild paresthesias.   She feels bladder function is good.   Vision is stable.  She reports fatigue.   Ritalin qAM  has helped.   She has sleep maintenance insomnia and wakes up a couple times most nights but is falling back asleep.     Fluoxetine helps anxiety better than the depression.    She has reduced attention and is on methylphenidate with benefit..       MS History: She was diagnosed with MS in August 2009 after presenting with right sided numbness.   She was referred to Dr. Krista Blue and she had MRi's c/w MS.  She was placed on Copaxone.    She started to see Dr. Jacqulynn Cadet for a second opinion and was placed on Tysabri.   For a couple years, I saw her  in a different practice as out infusion center was closer to her.   When Ocrevus became available around 2018, she switched and continues on it.   She has tolerated it well.    She did have one exacerbation in 2016 and did a 5 days of IV steroids.   She stopped Ocrevus 09/2022 after testing positive for hepatitis B.  She was briefly on Tecfidera but had difficulty with tolerability.  We discussed going back on Copaxone  Imaging: MRI of the brain 07/31/2021 showed no new lesions compared to the 06/14/2017 MRI.  Foci are noted in the cerebellum, right middle cerebellar peduncle  anterior pons, left posterior midbrain, and in the periventricular, juxtacortical and deep white matter of the hemispheres.  None of the foci enhanced  MRI of the brain 06/14/2017 showed T2/FLAIR hyperintense foci in the cerebellum, right middle cerebellar peduncle, anterior pons, left posterior midbrain, and in the periventricular, juxtacortical and deep white matter of the hemispheres.  None of the foci enhanced or appear to be acute.  MRI of the cervical and thoracic spine 01/31/2008 showed a normal spinal cord.  There is an atypical hemangioma within the T5 vertebral body.  REVIEW OF SYSTEMS: Constitutional: No fevers, chills, sweats, or change in appetite Eyes: No visual changes, double vision, eye pain Ear, nose and throat: No hearing loss, ear pain, nasal congestion, sore  throat Cardiovascular: No chest pain, palpitations Respiratory:  No shortness of breath at rest or with exertion.   No wheezes GastrointestinaI: No nausea, vomiting, diarrhea, abdominal pain, fecal incontinence Genitourinary:  No dysuria, urinary retention or frequency.  No nocturia. Musculoskeletal:  No neck pain, back pain Integumentary: No rash, pruritus, skin lesions Neurological: as above Psychiatric: No depression at this time.  No anxiety Endocrine: No palpitations, diaphoresis, change in appetite, change in weigh or increased thirst Hematologic/Lymphatic:  No anemia, purpura, petechiae. Allergic/Immunologic: No itchy/runny eyes, nasal congestion, recent allergic reactions, rashes  ALLERGIES: Allergies  Allergen Reactions   Statins Hives   Zetia [Ezetimibe]     HOME MEDICATIONS:  Current Outpatient Medications:    aspirin 81 MG chewable tablet, Chew 1 tablet by mouth every other day. Bayer Brand, Disp: , Rfl:    cholecalciferol (VITAMIN D3) 25 MCG (1000 UNIT) tablet, Take 1,000 Units by mouth daily., Disp: , Rfl:    Evolocumab (REPATHA SURECLICK) XX123456 MG/ML SOAJ, Inject 1 Dose into the skin every 14 (fourteen) days., Disp: 6 mL, Rfl: 3   FLUoxetine HCl 60 MG TABS, Take 1 tablet by mouth daily., Disp: , Rfl:    Multiple Vitamin (ONE DAILY MULTIVITAMIN ADULT) TABS, Take 1 tablet by mouth daily., Disp: , Rfl:    Omega-3 Fatty Acids (FISH OIL) 1000 MG CAPS, Take by mouth daily., Disp: , Rfl:    triamterene-hydrochlorothiazide (MAXZIDE-25) 37.5-25 MG per tablet, Take 1 tablet by mouth daily., Disp: , Rfl:    methylphenidate (RITALIN) 20 MG tablet, Take 1 tablet (20 mg total) by mouth 2 (two) times daily as needed., Disp: 60 tablet, Rfl: 0   valACYclovir (VALTREX) 1000 MG tablet, Take 1,000 mg by mouth daily. (Patient not taking: Reported on 08/20/2022), Disp: , Rfl:    VEMLIDY 25 MG TABS, Take 1 tablet by mouth daily. (Patient not taking: Reported on 08/20/2022), Disp: , Rfl:   PAST  MEDICAL HISTORY: Past Medical History:  Diagnosis Date   Anxiety    Chest pain    pressure in chest- ?related to MS- normal EKGs   Depression    Fatigue    related to MS   Headache    High cholesterol    Multiple sclerosis (HCC)    Neuromuscular disorder (HCC)    Multiple sclerosis   Shortness of breath dyspnea    on exertion- lack of exercise    Vaginal delivery 1999, 2003    PAST SURGICAL HISTORY: Past Surgical History:  Procedure Laterality Date   ABDOMINAL HYSTERECTOMY     BILATERAL SALPINGECTOMY Bilateral 02/09/2015   Procedure: BILATERAL SALPINGECTOMY;  Surgeon: Janyth Pupa, DO;  Location: [facility];  Service: Gynecology;  Laterality: Bilateral;   CHOLECYSTECTOMY     WISDOM TOOTH EXTRACTION  FAMILY HISTORY: Family History  Problem Relation Age of Onset   Breast cancer Mother    High Cholesterol Mother    High blood pressure Mother    High Cholesterol Father    High blood pressure Father    Glaucoma Father    Dementia Father        not fully dx yet   Aneurysm Maternal Grandmother    Glaucoma Paternal Grandmother     SOCIAL HISTORY:  Social History   Socioeconomic History   Marital status: Legally Separated    Spouse name: Not on file   Number of children: 2   Years of education: Not on file   Highest education level: Bachelor's degree (e.g., BA, AB, BS)  Occupational History   Not on file  Tobacco Use   Smoking status: Never   Smokeless tobacco: Never  Vaping Use   Vaping Use: Never used  Substance and Sexual Activity   Alcohol use: Yes    Alcohol/week: 2.0 standard drinks of alcohol    Types: 2 Glasses of wine per week   Drug use: Never   Sexual activity: Not on file  Other Topics Concern   Not on file  Social History Narrative   Lives w daughter    R handed   Caffeine: varies, 4 servings in a week   Social Determinants of Health   Financial Resource Strain: Not on file  Food Insecurity: Not on file  Transportation Needs: Not on  file  Physical Activity: Not on file  Stress: Not on file  Social Connections: Not on file  Intimate Partner Violence: Not on file     PHYSICAL EXAM (from 10/03/2021)  Vitals:   08/20/22 1248  BP: 134/84  Pulse: 86  Weight: 182 lb (82.6 kg)  Height: 5\' 6"  (1.676 m)    Body mass index is 29.38 kg/m.  No results found.   General: The patient is well-developed and well-nourished and in no acute distress  HEENT:  Head is [place]/AT.  Sclera are anicteric.    Skin: Extremities are without rash or  edema.   Neurologic Exam  Mental status: The patient is alert and oriented x 3 at the time of the examination. The patient has apparent normal recent and remote memory, with an apparently normal attention span and concentration ability.   Speech is normal.  Cranial nerves: Extraocular movements are full. Normal color vision.  Facial symmetry is present. There is good facial sensation to soft touch bilaterally.Facial strength is normal.  Trapezius and sternocleidomastoid strength is normal. No dysarthria is noted.   Motor:  Muscle bulk is normal.   Tone is normal. Strength is  5 / 5 in all 4 extremities.   Sensory: Sensory testing is intact to pinprick, soft touch and vibration sensation  Coordination: Cerebellar testing reveals good finger-nose-finger and heel-to-shin bilaterally.  Gait and station: Station is normal.   Gait is minimally wide and tandem is wide.   Her Romberg is negative.  Reflexes: Deep tendon reflexes are symmetric and normal bilaterally.         ASSESSMENT AND PLAN  Multiple sclerosis ([place])  Hepatitis B infection without delta agent without hepatic coma, unspecified chronicity  High risk medication use  Gait disturbance  Other fatigue  Depression with anxiety    She needed to sotp Ocrevus due to HBV.   Tysabri would be an option but she felt poolry on it and does not wish o go back pn.  Tecfidera poorly tolerated.  We discussed the other options and  she would like to get back on Copaxone/glatiramer. Renew Ritalin Stay active and exercise as tolerated. Return in 6 months or sooner if there are new or worsening neurologic symptoms.    Aunna Snooks A. Felecia Shelling, MD, [facility] [id number], [id number] PM Certified in Neurology, Clinical Neurophysiology, Sleep Medicine and Neuroimaging  [facility] [address] [place], [place] [address] [phone]

## 2022-08-21 ENCOUNTER — Telehealth: Payer: Self-pay | Admitting: *Deleted

## 2022-08-21 NOTE — Telephone Encounter (Signed)
Faxed completed/signed glatiramer start form to [employer] at [phone]. Received fax confirmation.  Dose: 40mg /ml SQ 3 times a week, dispense 1 box of 12 syringes, refills: 1 yr.

## 2022-08-24 DIAGNOSIS — N6012 Diffuse cystic mastopathy of left breast: Secondary | ICD-10-CM | POA: Diagnosis not present

## 2022-08-24 DIAGNOSIS — R928 Other abnormal and inconclusive findings on diagnostic imaging of breast: Secondary | ICD-10-CM | POA: Diagnosis not present

## 2022-08-28 NOTE — Telephone Encounter (Signed)
I called [employer] (IT). They have called the patient 3 times and left 3 messages. Patient should call them back at [phone].

## 2022-09-17 DIAGNOSIS — I2510 Atherosclerotic heart disease of native coronary artery without angina pectoris: Secondary | ICD-10-CM | POA: Diagnosis not present

## 2022-09-17 DIAGNOSIS — E782 Mixed hyperlipidemia: Secondary | ICD-10-CM | POA: Diagnosis not present

## 2022-09-17 DIAGNOSIS — I10 Essential (primary) hypertension: Secondary | ICD-10-CM | POA: Diagnosis not present

## 2022-09-17 DIAGNOSIS — E559 Vitamin D deficiency, unspecified: Secondary | ICD-10-CM | POA: Diagnosis not present

## 2022-09-17 DIAGNOSIS — R5382 Chronic fatigue, unspecified: Secondary | ICD-10-CM | POA: Diagnosis not present

## 2022-09-17 DIAGNOSIS — F5101 Primary insomnia: Secondary | ICD-10-CM | POA: Diagnosis not present

## 2022-09-17 DIAGNOSIS — G35 Multiple sclerosis: Secondary | ICD-10-CM | POA: Diagnosis not present

## 2022-09-17 DIAGNOSIS — Z0000 Encounter for general adult medical examination without abnormal findings: Secondary | ICD-10-CM | POA: Diagnosis not present

## 2022-10-03 NOTE — Telephone Encounter (Signed)
I called patient to confirm that she has received glatiramer.  No answer, no voicemail available.

## 2022-10-10 NOTE — Telephone Encounter (Signed)
I called patient.  She reports that she has received the glatiramer.  She is reluctant to start it however because she has been feeling so well.  I encouraged her to start the glatiramer to prevent further MS progression.  Of note, she reports that she has been on Ritalin for years but recently her blood pressure was suddenly elevated.  She is stopped the Ritalin altogether.  She reports that her blood pressure has normalized.  I advised her that it could have been multiple factors that cause elevation in blood pressure rather than just the Ritalin, especially if she has been on Ritalin and tolerated it well for years.  She will continue to stay off Ritalin for now.  Just FYI.

## 2022-10-23 ENCOUNTER — Ambulatory Visit: Payer: Medicare Other | Admitting: Internal Medicine

## 2022-12-17 DIAGNOSIS — I10 Essential (primary) hypertension: Secondary | ICD-10-CM | POA: Diagnosis not present

## 2022-12-17 DIAGNOSIS — G35 Multiple sclerosis: Secondary | ICD-10-CM | POA: Diagnosis not present

## 2023-01-02 ENCOUNTER — Other Ambulatory Visit: Payer: Self-pay | Admitting: Internal Medicine

## 2023-01-02 DIAGNOSIS — E7849 Other hyperlipidemia: Secondary | ICD-10-CM

## 2023-01-03 DIAGNOSIS — E7849 Other hyperlipidemia: Secondary | ICD-10-CM | POA: Diagnosis not present

## 2023-01-07 ENCOUNTER — Encounter: Payer: Self-pay | Admitting: Internal Medicine

## 2023-01-07 ENCOUNTER — Ambulatory Visit: Payer: Medicare Other | Attending: Internal Medicine | Admitting: Internal Medicine

## 2023-01-07 VITALS — BP 124/78 | HR 79 | Ht 66.0 in | Wt 203.0 lb

## 2023-01-07 DIAGNOSIS — E7849 Other hyperlipidemia: Secondary | ICD-10-CM | POA: Diagnosis not present

## 2023-01-07 DIAGNOSIS — E7841 Elevated Lipoprotein(a): Secondary | ICD-10-CM | POA: Diagnosis not present

## 2023-01-07 DIAGNOSIS — Z888 Allergy status to other drugs, medicaments and biological substances status: Secondary | ICD-10-CM | POA: Diagnosis not present

## 2023-01-07 NOTE — Progress Notes (Signed)
LIPID CLINIC CONSULT NOTE  Chief Complaint:  Follow-up dyslipidemia  Primary Care Physician: Thana Ates, MD  Primary Cardiologist:  None  HPI:  [name] is a 52 y.o. female who is being seen today for the evaluation of dyslipidemia at the request of Sater, Pearletha Furl, MD. This is a pleasant 52 year old female kindly referred for evaluation management of dyslipidemia.  She reports longstanding history of high cholesterol back into her 88s.  She is felt that her diet has been not ideal although there is some heart disease in the family.  In the past she had been tried on statins but had a significant whole body rash and was thought to be allergic.  She had also apparently tried ezetimibe but was taken off of that due to intolerance.  More recently she has been on no therapy for her cholesterol.  She did have lab work in March 2023 which showed total cholesterol 275, HDL 54, triglycerides 102 and LDL 203.  In addition in April she underwent coronary artery calcium scoring.  The calcium score is elevated showing a calcium score of 56, 96th percentile for age and sex matched controls.  Based on that she was referred for additional therapy.  Of note, she has a diagnosis of multiple sclerosis on Ocrevus (anti-CD20 antibody therapy) -it is recommended that she have surveillance viral testing on this medication and she did recently obtain a diagnosis of chronic hepatitis B.  She was referred to see Annamarie Major, NP and plans to start a suppressive antiviral therapy with Vemlidy.  She reports her diet does have a number of fatty or fried foods and we talked about the importance of reducing saturated fats and increasing exercise.  04/13/2022  Ms. [name] returns today for follow-up.  She has been using Repatha except for about 1 month where she had some issue with it.  She has been injecting in her arms, abdomen and thighs.  She had an issue in the left arm possibly related to an injection with what she  described as "drawing" left arm pain without associated chest pain.  It sounds like she has at least had 2 doses +2 weeks prior to recent labs.  She has had significant reduction in her lipids.  Her LP(a) showed an LDL particle #1203, LDL-C of 118 (down from 203), HDL 64, triglycerides 80 and small LDL particle number is low at 259.  Not surprisingly her LP(a) is elevated at 206.7 nmol/L, probably 30% lower than it was prior to starting on Repatha.  There are no other significant options for lowering at this time as this is genetically determined and not affected by diet, lifestyle or activity.  This is a significant risk factor for early onset heart disease and may explain her elevated calcium score.  01/07/2023  Ms. [name] returns today for follow-up.  She reports that she has been off of all of her meds.  She reported that there was some people tampering with her medications.  This is since April.  She says that her PCP actually did take her off of her blood pressure meds.  Blood pressure is well-controlled today.  Her lipids have gone up considerably.  LDL is up to 176 from 118 and LDL particle number is now 1978, up from 1203.  She did have a high LP(a) as above.  Fortunately the Repatha was well-tolerated.  PMHx:  Past Medical History:  Diagnosis Date   Anxiety    Chest pain  pressure in chest- ?related to MS- normal EKGs   Depression    Fatigue    related to MS   Headache    High cholesterol    Multiple sclerosis (HCC)    Neuromuscular disorder (HCC)    Multiple sclerosis   Shortness of breath dyspnea    on exertion- lack of exercise    Vaginal delivery 1999, 2003    Past Surgical History:  Procedure Laterality Date   ABDOMINAL HYSTERECTOMY     BILATERAL SALPINGECTOMY Bilateral 02/09/2015   Procedure: BILATERAL SALPINGECTOMY;  Surgeon: Myna Hidalgo, DO;  Location: [facility];  Service: Gynecology;  Laterality: Bilateral;   CHOLECYSTECTOMY     WISDOM TOOTH EXTRACTION      FAMHx:   Family History  Problem Relation Age of Onset   Breast cancer Mother    High Cholesterol Mother    High blood pressure Mother    High Cholesterol Father    High blood pressure Father    Glaucoma Father    Dementia Father        not fully dx yet   Aneurysm Maternal Grandmother    Glaucoma Paternal Grandmother     SOCHx:   reports that she has never smoked. She has never used smokeless tobacco. She reports current alcohol use of about 2.0 standard drinks of alcohol per week. She reports that she does not use drugs.  ALLERGIES:  Allergies  Allergen Reactions   Statins Hives   Zetia [Ezetimibe]     ROS: Pertinent items noted in HPI and remainder of comprehensive ROS otherwise negative.  HOME MEDS: Current Outpatient Medications on File Prior to Visit  Medication Sig Dispense Refill   aspirin 81 MG chewable tablet Chew 1 tablet by mouth every other day. Bayer Brand (Patient not taking: Reported on 01/07/2023)     cholecalciferol (VITAMIN D3) 25 MCG (1000 UNIT) tablet Take 1,000 Units by mouth daily. (Patient not taking: Reported on 01/07/2023)     Evolocumab (REPATHA SURECLICK) 140 MG/ML SOAJ Inject 1 Dose into the skin every 14 (fourteen) days. (Patient not taking: Reported on 01/07/2023) 6 mL 3   FLUoxetine HCl 60 MG TABS Take 1 tablet by mouth daily. (Patient not taking: Reported on 01/07/2023)     Glatiramer Acetate 40 MG/ML SOSY Inject 40 mg into the skin 3 (three) times a week. (Patient not taking: Reported on 01/07/2023)     methylphenidate (RITALIN) 20 MG tablet Take 1 tablet (20 mg total) by mouth 2 (two) times daily as needed. (Patient not taking: Reported on 01/07/2023) 60 tablet 0   Multiple Vitamin (ONE DAILY MULTIVITAMIN ADULT) TABS Take 1 tablet by mouth daily. (Patient not taking: Reported on 01/07/2023)     Omega-3 Fatty Acids (FISH OIL) 1000 MG CAPS Take by mouth daily. (Patient not taking: Reported on 01/07/2023)     triamterene-hydrochlorothiazide (MAXZIDE-25) 37.5-25 MG per  tablet Take 1 tablet by mouth daily. (Patient not taking: Reported on 01/07/2023)     valACYclovir (VALTREX) 1000 MG tablet Take 1,000 mg by mouth daily. (Patient not taking: Reported on 08/20/2022)     VEMLIDY 25 MG TABS Take 1 tablet by mouth daily. (Patient not taking: Reported on 08/20/2022)     No current facility-administered medications on file prior to visit.    LABS/IMAGING: No results found for this or any previous visit (from the past 48 hour(s)). No results found.  LIPID PANEL: No results found for: "CHOL", "TRIG", "HDL", "CHOLHDL", "VLDL", "LDLCALC", "LDLDIRECT"  WEIGHTS: Wt Readings from  Last 3 Encounters:  01/07/23 203 lb (92.1 kg)  08/20/22 182 lb (82.6 kg)  04/13/22 178 lb 6.4 oz (80.9 kg)    VITALS: BP 124/78   Pulse 79   Ht 5\' 6"  (1.676 m)   Wt 203 lb (92.1 kg)   LMP 12/18/2014 (Approximate)   SpO2 98%   BMI 32.77 kg/m   EXAM: Deferred  EKG: N/A  ASSESSMENT: Probable familial hyperlipidemia, LDL greater than 190 (Simon Broome Criteria) High cholesterol in both parents Statin allergic-hives Ezetimibe intolerant Elevated CAC score of 56, 96th percentile for age and sex matched controls (09/2021) Chronic hepatitis B-anticipating treatment Multiple sclerosis on Ocrevus 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   Ms. Tiburcio has had a rebound increase in her cholesterol after stopping her Repatha back in April.  She needs to get back on the medication.  She does have an active prescription I encouraged her to fill a new prescription since she is likely due for that and restart the medications.  Will plan to repeat lipid in about 2 months and follow-up with me in 6 months.  Chrystie Nose, MD, [facility], FACP  [place]  [facility]  Medical Director of the Advanced Lipid Disorders &  Cardiovascular Risk Reduction Clinic Diplomate of the American Board of Clinical Lipidology Attending Cardiologist  Direct Dial: [phone]  Fax: [phone]  Website:   [email or web address]  Lisette Abu  01/07/2023, 8:12 AM

## 2023-01-07 NOTE — Patient Instructions (Signed)
Medication Instructions:  No changes restart as discussed at visit. *If you need a refill on your cardiac medications before your next appointment, please call your pharmacy*   Lab Work: NMR in 2 months fasting If you have labs (blood work) drawn today and your tests are completely normal, you will receive your results only by: MyChart Message (if you have MyChart) OR A paper copy in the mail If you have any lab test that is abnormal or we need to change your treatment, we will call you to review the results.   Testing/Procedures: No labs   Follow-Up: At [facility], you and your health needs are our priority.  As part of our continuing mission to provide you with exceptional heart care, we have created designated Provider Care Teams.  These Care Teams include your primary Cardiologist (physician) and Advanced Practice Providers (APPs -  Physician Assistants and Nurse Practitioners) who all work together to provide you with the care you need, when you need it.   Your next appointment:   6 month(s)  Provider:   Chrystie Nose, MD

## 2023-02-15 DIAGNOSIS — H40013 Open angle with borderline findings, low risk, bilateral: Secondary | ICD-10-CM | POA: Diagnosis not present

## 2023-02-15 DIAGNOSIS — H2513 Age-related nuclear cataract, bilateral: Secondary | ICD-10-CM | POA: Diagnosis not present

## 2023-02-15 DIAGNOSIS — D3131 Benign neoplasm of right choroid: Secondary | ICD-10-CM | POA: Diagnosis not present

## 2023-02-15 DIAGNOSIS — G35 Multiple sclerosis: Secondary | ICD-10-CM | POA: Diagnosis not present

## 2023-02-15 DIAGNOSIS — H53461 Homonymous bilateral field defects, right side: Secondary | ICD-10-CM | POA: Diagnosis not present

## 2023-02-25 ENCOUNTER — Telehealth: Payer: Self-pay | Admitting: Neurology

## 2023-02-25 NOTE — Telephone Encounter (Signed)
She had an ophthalmology evaluation.  02/15/2023.  Best corrected vision was 20/40-2 out of either eye.  Optic nerve on the left was thinned.  OCT RNFL showed moderate thinning OU.  This was stable.   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

## 2023-03-01 DIAGNOSIS — N6321 Unspecified lump in the left breast, upper outer quadrant: Secondary | ICD-10-CM | POA: Diagnosis not present

## 2023-03-01 DIAGNOSIS — R928 Other abnormal and inconclusive findings on diagnostic imaging of breast: Secondary | ICD-10-CM | POA: Diagnosis not present

## 2023-03-07 ENCOUNTER — Telehealth: Payer: Self-pay | Admitting: Neurology

## 2023-03-07 ENCOUNTER — Ambulatory Visit: Payer: Medicare Other | Admitting: Neurology

## 2023-03-07 ENCOUNTER — Encounter: Payer: Self-pay | Admitting: Neurology

## 2023-03-07 VITALS — BP 137/83 | HR 77 | Ht 66.0 in | Wt 206.0 lb

## 2023-03-07 DIAGNOSIS — Z79899 Other long term (current) drug therapy: Secondary | ICD-10-CM

## 2023-03-07 DIAGNOSIS — R269 Unspecified abnormalities of gait and mobility: Secondary | ICD-10-CM

## 2023-03-07 DIAGNOSIS — G35 Multiple sclerosis: Secondary | ICD-10-CM

## 2023-03-07 DIAGNOSIS — B1910 Unspecified viral hepatitis B without hepatic coma: Secondary | ICD-10-CM

## 2023-03-07 DIAGNOSIS — G35D Multiple sclerosis, unspecified: Secondary | ICD-10-CM

## 2023-03-07 NOTE — Telephone Encounter (Signed)
[employer] medicare NPR sent to GI [phone] 

## 2023-03-07 NOTE — Progress Notes (Signed)
[facility]  PATIENT: [name] DOB: 1970/06/11  REFERRING DOCTOR OR PCP:  Hillard Danker, MD; Eloy End, MD [facility] neurology) SOURCE: Patient, notes from [facility] neurology, imaging and laboratory reports, MRI images personally reviewed  _________________________________   HISTORICAL  CHIEF COMPLAINT:  Chief Complaint  Patient presents with   Room 11    Pt is here Alone. Pt states that she has been doing great since her last appointment. Pt states that she doesn't have any new symptoms,questions, or concerns to discuss today.     HISTORY OF PRESENT ILLNESS:  [name] is a 52 y.o. woman with  multiple sclerosis.    Update 03/07/2023 She is not currently on a DMT.    She was going to start Copaxone but decided she prefers not to do a shot.    She is planning to move to [facility] (near [place]).      She had been on Tysabri for several years and tolerated it well but was concerned about the risk of PML.  Most recent JCV test (2023) is negative.  Because of that concern, she switched to Ocrevus several years ago in 2018.   We stopped when we discovered Hep B was positive.    Her last Ocrevus was in October 2023 .   Her first exacerbation was in 2009 leading to diagnosis  She had another exacerbation in 04/2015 and she had 5 days IV Solu-Medrol.   Her symptoms were slurred speech.      She has not taken the medications for Hep B. Her last HBV DNA was markedly elevated.     She has no known risk factors.  She was on [facility] but stopped.  I advised her to return back to hepatology.   Of note, in 2018 she had elevated liver function tests several times in a row (we do not have the actual labs) and she was felt to have the abnormality due to supplements.  Currently, she is stable.    Gait and balance are stable.   She is starting to walk more now that the temperatures are better.     Her right leg is clumsy but not weak    She uses the bannister on stairs.   She denies numbness or tingling.  However, occasionally she gets right side mild paresthesias.   She feels bladder function is good.   Vision is stable.  She reports fatigue.   Ritalin qAM helped but she has stopped most of her med's    She has sleep maintenance insomnia and wakes up a couple times most nights but is falling back asleep.   Mood is doing well off fluoxetine.    MS History: She was diagnosed with MS in August 2009 after presenting with right sided numbness.   She was referred to Dr. Terrace Arabia and she had MRi's c/w MS.  She was placed on Copaxone.    She started to see Dr. Leotis Shames for a second opinion and was placed on Tysabri.   For a couple years, I saw her  in a different practice as out infusion center was closer to her.   When Ocrevus became available around 2018, she switched and continues on it.   She has tolerated it well.    She did have one exacerbation in 2016 and did a 5 days of IV steroids.   She stopped Ocrevus 09/2022 after testing positive for hepatitis B (last infusion 03/2022).  She was briefly on  Tecfidera but had difficulty with tolerability.  We discussed going back on Copaxone  Imaging: MRI of the brain 07/31/2021 showed no new lesions compared to the 06/14/2017 MRI.  Foci are noted in the cerebellum, right middle cerebellar peduncle anterior pons, left posterior midbrain, and in the periventricular, juxtacortical and deep white matter of the hemispheres.  None of the foci enhanced  MRI of the brain 06/14/2017 showed T2/FLAIR hyperintense foci in the cerebellum, right middle cerebellar peduncle, anterior pons, left posterior midbrain, and in the periventricular, juxtacortical and deep white matter of the hemispheres.  None of the foci enhanced or appear to be acute.  MRI of the cervical and thoracic spine 01/31/2008 showed a normal spinal cord.  There is an atypical hemangioma within the T5 vertebral body.  REVIEW OF SYSTEMS: Constitutional: No fevers, chills, sweats, or  change in appetite Eyes: No visual changes, double vision, eye pain Ear, nose and throat: No hearing loss, ear pain, nasal congestion, sore throat Cardiovascular: No chest pain, palpitations Respiratory:  No shortness of breath at rest or with exertion.   No wheezes GastrointestinaI: No nausea, vomiting, diarrhea, abdominal pain, fecal incontinence Genitourinary:  No dysuria, urinary retention or frequency.  No nocturia. Musculoskeletal:  No neck pain, back pain Integumentary: No rash, pruritus, skin lesions Neurological: as above Psychiatric: No depression at this time.  No anxiety Endocrine: No palpitations, diaphoresis, change in appetite, change in weigh or increased thirst Hematologic/Lymphatic:  No anemia, purpura, petechiae. Allergic/Immunologic: No itchy/runny eyes, nasal congestion, recent allergic reactions, rashes  ALLERGIES: Allergies  Allergen Reactions   Statins Hives   Zetia [Ezetimibe]     HOME MEDICATIONS:  Current Outpatient Medications:    aspirin 81 MG chewable tablet, Chew 1 tablet by mouth every other day. Bayer Brand (Patient not taking: Reported on 01/07/2023), Disp: , Rfl:    cholecalciferol (VITAMIN D3) 25 MCG (1000 UNIT) tablet, Take 1,000 Units by mouth daily. (Patient not taking: Reported on 01/07/2023), Disp: , Rfl:    Evolocumab (REPATHA SURECLICK) 140 MG/ML SOAJ, Inject 1 Dose into the skin every 14 (fourteen) days. (Patient not taking: Reported on 01/07/2023), Disp: 6 mL, Rfl: 3   FLUoxetine HCl 60 MG TABS, Take 1 tablet by mouth daily. (Patient not taking: Reported on 01/07/2023), Disp: , Rfl:    Glatiramer Acetate 40 MG/ML SOSY, Inject 40 mg into the skin 3 (three) times a week. (Patient not taking: Reported on 01/07/2023), Disp: , Rfl:    methylphenidate (RITALIN) 20 MG tablet, Take 1 tablet (20 mg total) by mouth 2 (two) times daily as needed. (Patient not taking: Reported on 01/07/2023), Disp: 60 tablet, Rfl: 0   Multiple Vitamin (ONE DAILY MULTIVITAMIN ADULT)  TABS, Take 1 tablet by mouth daily. (Patient not taking: Reported on 01/07/2023), Disp: , Rfl:    Omega-3 Fatty Acids (FISH OIL) 1000 MG CAPS, Take by mouth daily. (Patient not taking: Reported on 01/07/2023), Disp: , Rfl:    triamterene-hydrochlorothiazide (MAXZIDE-25) 37.5-25 MG per tablet, Take 1 tablet by mouth daily. (Patient not taking: Reported on 01/07/2023), Disp: , Rfl:    valACYclovir (VALTREX) 1000 MG tablet, Take 1,000 mg by mouth daily. (Patient not taking: Reported on 08/20/2022), Disp: , Rfl:    VEMLIDY 25 MG TABS, Take 1 tablet by mouth daily. (Patient not taking: Reported on 08/20/2022), Disp: , Rfl:   PAST MEDICAL HISTORY: Past Medical History:  Diagnosis Date   Anxiety    Chest pain    pressure in chest- ?related to MS- normal EKGs  Depression    Fatigue    related to MS   Headache    High cholesterol    Multiple sclerosis (HCC)    Neuromuscular disorder (HCC)    Multiple sclerosis   Shortness of breath dyspnea    on exertion- lack of exercise    Vaginal delivery 1999, 2003    PAST SURGICAL HISTORY: Past Surgical History:  Procedure Laterality Date   ABDOMINAL HYSTERECTOMY     BILATERAL SALPINGECTOMY Bilateral 02/09/2015   Procedure: BILATERAL SALPINGECTOMY;  Surgeon: Myna Hidalgo, DO;  Location: [facility];  Service: Gynecology;  Laterality: Bilateral;   CHOLECYSTECTOMY     WISDOM TOOTH EXTRACTION      FAMILY HISTORY: Family History  Problem Relation Age of Onset   Breast cancer Mother    High Cholesterol Mother    High blood pressure Mother    High Cholesterol Father    High blood pressure Father    Glaucoma Father    Dementia Father        not fully dx yet   Aneurysm Maternal Grandmother    Glaucoma Paternal Grandmother     SOCIAL HISTORY:  Social History   Socioeconomic History   Marital status: Legally Separated    Spouse name: Not on file   Number of children: 2   Years of education: Not on file   Highest education level: Bachelor's degree  (e.g., BA, AB, BS)  Occupational History   Not on file  Tobacco Use   Smoking status: Never   Smokeless tobacco: Never  Vaping Use   Vaping status: Never Used  Substance and Sexual Activity   Alcohol use: Yes    Alcohol/week: 2.0 standard drinks of alcohol    Types: 2 Glasses of wine per week   Drug use: Never   Sexual activity: Not on file  Other Topics Concern   Not on file  Social History Narrative   Lives w daughter    R handed   Caffeine: varies, 4 servings in a week   Social Determinants of Health   Financial Resource Strain: Not on file  Food Insecurity: Medium Risk (05/14/2022)   [facility]   Hunger Vital Sign    Worried About Running Out of Food in the Last Year: Never true    Ran Out of Food in the Last Year: Sometimes true  Transportation Needs: Not on file (05/14/2022)  Physical Activity: Not on file  Stress: Not on file  Social Connections: Not on file  Intimate Partner Violence: Not on file     PHYSICAL EXAM (from 10/03/2021)  Vitals:   03/07/23 0941  BP: 137/83  Pulse: 77  Weight: 206 lb (93.4 kg)  Height: 5\' 6"  (1.676 m)    Body mass index is 33.25 kg/m.  No results found.   General: The patient is well-developed and well-nourished and in no acute distress  HEENT:  Head is [place]/AT.  Sclera are anicteric.    Skin: Extremities are without rash or  edema.   Neurologic Exam  Mental status: The patient is alert and oriented x 3 at the time of the examination. The patient has apparent normal recent and remote memory, with an apparently normal attention span and concentration ability.   Speech is normal.  Cranial nerves: Extraocular movements are full. Normal color vision.  Facial symmetry is present. There is good facial sensation to soft touch bilaterally.Facial strength is normal.  Trapezius and sternocleidomastoid strength is normal. No dysarthria is noted.  Motor:  Muscle bulk is normal.   Tone is normal.  Strength is  5 / 5 in all 4 extremities.   Sensory: Sensory testing is intact to pinprick, soft touch and vibration sensation  Coordination: Cerebellar testing reveals good finger-nose-finger and heel-to-shin bilaterally.  Gait and station: Station is normal.  Her gait is near normal.  The tandem gait is wide.   Her Romberg is negative.  Reflexes: Deep tendon reflexes are symmetric and normal bilaterally.         ASSESSMENT AND PLAN  Multiple sclerosis (HCC) - Plan: MR BRAIN W WO CONTRAST  High risk medication use  Gait disturbance - Plan: MR BRAIN W WO CONTRAST  Hepatitis B infection without delta agent without hepatic coma, unspecified chronicity    She needed to stop Ocrevus due to HBV.   Initially she Thought to Going on Copaxone and We Got Her Started but She Decided to Stop.  Tysabri would be an option but she felt poolry on it and does not wish o go back on that medication.Gil[place] Raid poorly tolerated.  For now, she wants to remain off of a disease modifying therapy.   She has opted to stop the Ritalin and sertraline.  She feels mood is doing well.   Stay active and exercise as tolerated. Advised to follow-up with GI/hepatology for her hep B.   Return in 6 months or sooner if there are new or worsening neurologic symptoms.  She is planning on moving to the [place] area soon but would like to continue to see me.    This visit is part of a comprehensive longitudinal care medical relationship regarding the patients primary diagnosis of MS and related concerns.    [name] A. Epimenio Foot, MD, [facility] 03/07/2023, 12:05 PM Certified in Neurology, Clinical Neurophysiology, Sleep Medicine and Neuroimaging  [facility] [address], [address] [place], [place] [address] [phone]

## 2023-04-20 ENCOUNTER — Other Ambulatory Visit: Payer: Medicare Other

## 2023-08-26 ENCOUNTER — Encounter: Payer: Self-pay | Admitting: Neurology

## 2023-08-26 DIAGNOSIS — R030 Elevated blood-pressure reading, without diagnosis of hypertension: Secondary | ICD-10-CM | POA: Diagnosis not present

## 2023-08-26 DIAGNOSIS — G35 Multiple sclerosis: Secondary | ICD-10-CM | POA: Diagnosis not present

## 2023-08-26 DIAGNOSIS — W1830XA Fall on same level, unspecified, initial encounter: Secondary | ICD-10-CM | POA: Diagnosis not present

## 2023-08-29 DIAGNOSIS — R030 Elevated blood-pressure reading, without diagnosis of hypertension: Secondary | ICD-10-CM | POA: Diagnosis not present

## 2023-08-29 DIAGNOSIS — I10 Essential (primary) hypertension: Secondary | ICD-10-CM | POA: Diagnosis not present

## 2023-09-04 ENCOUNTER — Ambulatory Visit: Admitting: Neurology

## 2023-09-04 ENCOUNTER — Encounter: Payer: Self-pay | Admitting: Neurology

## 2023-09-04 VITALS — BP 140/85 | HR 84 | Ht 66.0 in | Wt 192.5 lb

## 2023-09-04 DIAGNOSIS — R5383 Other fatigue: Secondary | ICD-10-CM

## 2023-09-04 DIAGNOSIS — F418 Other specified anxiety disorders: Secondary | ICD-10-CM | POA: Diagnosis not present

## 2023-09-04 DIAGNOSIS — R55 Syncope and collapse: Secondary | ICD-10-CM | POA: Diagnosis not present

## 2023-09-04 DIAGNOSIS — R269 Unspecified abnormalities of gait and mobility: Secondary | ICD-10-CM

## 2023-09-04 DIAGNOSIS — G35 Multiple sclerosis: Secondary | ICD-10-CM | POA: Diagnosis not present

## 2023-09-04 DIAGNOSIS — F988 Other specified behavioral and emotional disorders with onset usually occurring in childhood and adolescence: Secondary | ICD-10-CM

## 2023-09-04 DIAGNOSIS — I10 Essential (primary) hypertension: Secondary | ICD-10-CM

## 2023-09-04 NOTE — Telephone Encounter (Signed)
 Called pt. Rescheduled appt from 09/11/23 to 09/04/23 at 3pm, check in 230pm. She will bring updated insurance cards/med list to appt.

## 2023-09-04 NOTE — Progress Notes (Signed)
 [facility]  PATIENT: [name] DOB: 1971-01-11  REFERRING DOCTOR OR PCP:  Hillard Danker, MD; Eloy End, MD [facility] neurology) SOURCE: Patient, notes from [facility] neurology, imaging and laboratory reports, MRI images personally reviewed  _________________________________   HISTORICAL  CHIEF COMPLAINT:  Chief Complaint  Patient presents with   Follow-up    Pt in room 10. Alone. Here for MS follow up. Pt had recent falls about 2 weeks ago. Pt reports doing well. No concerns.     HISTORY OF PRESENT ILLNESS:  [name] is a 53 y.o. woman with  multiple sclerosis.    Update 09/04/2023 About 10 days ago, she was having a busy day and had not had a meal all day.  She stood up from eating feeling a bit flushed and took a few steps and then collapsed.  She had lightheadedness and dizziness. .  No tunnel vision.  She bruised up her face and broke her glasses.  Her mother was not next to her but came over and put a washcloth on her face.   She felt better and 10 minutes later was baseline.   She generally does not have pre-syncope and no other syncope events.   Her BP had been elevated and was 180/115 when she went to Urgent Care.   EKG was reportedly fine.  She did not bite tongue or have incontinence.       She is not currently on a DMT.    She was going to start Copaxone in 2024 but decided she prefers not to do a shot.    We needed to stop the Ocrevus.  She had been on Tysabri for several years and tolerated it well but was concerned about the risk of PML.  Most recent JCV test (2023) is negative.  Because of that concern, she switched to Ocrevus several years ago in 2018.   We stopped when we discovered Hep B was positive.    She is not on anything for her hepatitis and is not currently doig any medication for this.    Her last Ocrevus was in October 2023 .   Her first exacerbation was in 2009 leading to diagnosis  She had another exacerbation in 04/2015  and she had 5 days IV Solu-Medrol.   Her symptoms were slurred speech.      She has not taken the medications for Hep B. Her last HBV DNA was markedly elevated.     She has no known risk factors.  She was on [facility] but stopped.  I advised her to return back to hepatology.   Of note, in 2018 she had elevated liver function tests several times in a row (we do not have the actual labs) and she was felt to have the abnormality due to supplements.  Currently, she is stable.    Gait and balance are stable.   She is starting to walk more now that the temperatures are better.     Her right leg is clumsy but not weak    She uses the bannister on stairs.  She denies numbness or tingling.  However, occasionally she gets right side mild paresthesias.   She feels bladder function is good.   Vision is stable.  She reports fatigue.   Ritalin qAM helped but she has stopped most of her med's    She has sleep maintenance insomnia and wakes up a couple times most nights but is falling back asleep.   Mood  is doing well off fluoxetine.    MS History: She was diagnosed with MS in August 2009 after presenting with right sided numbness.   She was referred to Dr. Terrace Arabia and she had MRi's c/w MS.  She was placed on Copaxone.    She started to see Dr. Leotis Shames for a second opinion and was placed on Tysabri.   For a couple years, I saw her  in a different practice as out infusion center was closer to her.   When Ocrevus became available around 2018, she switched and continues on it.   She has tolerated it well.    She did have one exacerbation in 2016 and did a 5 days of IV steroids.   She stopped Ocrevus 09/2022 after testing positive for hepatitis B (last infusion 03/2022).  She was briefly on Tecfidera but had difficulty with tolerability.  We discussed going back on Copaxone  Imaging: MRI of the brain 07/31/2021 showed no new lesions compared to the 06/14/2017 MRI.  Foci are noted in the cerebellum, right middle cerebellar peduncle  anterior pons, left posterior midbrain, and in the periventricular, juxtacortical and deep white matter of the hemispheres.  None of the foci enhanced  MRI of the brain 06/14/2017 showed T2/FLAIR hyperintense foci in the cerebellum, right middle cerebellar peduncle, anterior pons, left posterior midbrain, and in the periventricular, juxtacortical and deep white matter of the hemispheres.  None of the foci enhanced or appear to be acute.  MRI of the cervical and thoracic spine 01/31/2008 showed a normal spinal cord.  There is an atypical hemangioma within the T5 vertebral body.  REVIEW OF SYSTEMS: Constitutional: No fevers, chills, sweats, or change in appetite Eyes: No visual changes, double vision, eye pain Ear, nose and throat: No hearing loss, ear pain, nasal congestion, sore throat Cardiovascular: No chest pain, palpitations Respiratory:  No shortness of breath at rest or with exertion.   No wheezes GastrointestinaI: No nausea, vomiting, diarrhea, abdominal pain, fecal incontinence Genitourinary:  No dysuria, urinary retention or frequency.  No nocturia. Musculoskeletal:  No neck pain, back pain Integumentary: No rash, pruritus, skin lesions Neurological: as above Psychiatric: No depression at this time.  No anxiety Endocrine: No palpitations, diaphoresis, change in appetite, change in weigh or increased thirst Hematologic/Lymphatic:  No anemia, purpura, petechiae. Allergic/Immunologic: No itchy/runny eyes, nasal congestion, recent allergic reactions, rashes  ALLERGIES: Allergies  Allergen Reactions   Statins Hives   Zetia [Ezetimibe]     HOME MEDICATIONS:  Current Outpatient Medications:    triamterene-hydrochlorothiazide (MAXZIDE-25) 37.5-25 MG per tablet, Take 1 tablet by mouth daily., Disp: , Rfl:    aspirin 81 MG chewable tablet, Chew 1 tablet by mouth every other day. Bayer Brand (Patient not taking: Reported on 09/04/2023), Disp: , Rfl:    cholecalciferol (VITAMIN D3) 25 MCG  (1000 UNIT) tablet, Take 1,000 Units by mouth daily. (Patient not taking: Reported on 09/04/2023), Disp: , Rfl:    Evolocumab (REPATHA SURECLICK) 140 MG/ML SOAJ, Inject 1 Dose into the skin every 14 (fourteen) days. (Patient not taking: Reported on 09/04/2023), Disp: 6 mL, Rfl: 3   FLUoxetine HCl 60 MG TABS, Take 1 tablet by mouth daily. (Patient not taking: Reported on 09/04/2023), Disp: , Rfl:    Glatiramer Acetate 40 MG/ML SOSY, Inject 40 mg into the skin 3 (three) times a week. (Patient not taking: Reported on 09/04/2023), Disp: , Rfl:    methylphenidate (RITALIN) 20 MG tablet, Take 1 tablet (20 mg total) by mouth 2 (two) times  daily as needed. (Patient not taking: Reported on 09/04/2023), Disp: 60 tablet, Rfl: 0   Multiple Vitamin (ONE DAILY MULTIVITAMIN ADULT) TABS, Take 1 tablet by mouth daily. (Patient not taking: Reported on 09/04/2023), Disp: , Rfl:    Omega-3 Fatty Acids (FISH OIL) 1000 MG CAPS, Take by mouth daily. (Patient not taking: Reported on 01/07/2023), Disp: , Rfl:    valACYclovir (VALTREX) 1000 MG tablet, Take 1,000 mg by mouth daily. (Patient not taking: Reported on 09/04/2023), Disp: , Rfl:    VEMLIDY 25 MG TABS, Take 1 tablet by mouth daily. (Patient not taking: Reported on 09/04/2023), Disp: , Rfl:   PAST MEDICAL HISTORY: Past Medical History:  Diagnosis Date   Anxiety    Chest pain    pressure in chest- ?related to MS- normal EKGs   Depression    Fatigue    related to MS   Headache    High cholesterol    Multiple sclerosis (HCC)    Neuromuscular disorder (HCC)    Multiple sclerosis   Shortness of breath dyspnea    on exertion- lack of exercise    Vaginal delivery 1999, 2003    PAST SURGICAL HISTORY: Past Surgical History:  Procedure Laterality Date   ABDOMINAL HYSTERECTOMY     BILATERAL SALPINGECTOMY Bilateral 02/09/2015   Procedure: BILATERAL SALPINGECTOMY;  Surgeon: Myna Hidalgo, DO;  Location: [facility];  Service: Gynecology;  Laterality: Bilateral;   CHOLECYSTECTOMY      WISDOM TOOTH EXTRACTION      FAMILY HISTORY: Family History  Problem Relation Age of Onset   Breast cancer Mother    High Cholesterol Mother    High blood pressure Mother    High Cholesterol Father    High blood pressure Father    Glaucoma Father    Dementia Father        not fully dx yet   Aneurysm Maternal Grandmother    Glaucoma Paternal Grandmother     SOCIAL HISTORY:  Social History   Socioeconomic History   Marital status: Legally Separated    Spouse name: Not on file   Number of children: 2   Years of education: Not on file   Highest education level: Bachelor's degree (e.g., BA, AB, BS)  Occupational History   Not on file  Tobacco Use   Smoking status: Never   Smokeless tobacco: Never  Vaping Use   Vaping status: Never Used  Substance and Sexual Activity   Alcohol use: Yes    Alcohol/week: 2.0 standard drinks of alcohol    Types: 2 Glasses of wine per week   Drug use: Never   Sexual activity: Not on file  Other Topics Concern   Not on file  Social History Narrative   Lives w daughter    R handed   Caffeine: varies, 4 servings in a week   Social Drivers of Corporate investment banker Strain: Not on file  Food Insecurity: Medium Risk (05/14/2022)   [facility]   Hunger Vital Sign    Worried About Running Out of Food in the Last Year: Never true    Ran Out of Food in the Last Year: Sometimes true  Transportation Needs: Not on file (05/14/2022)  Physical Activity: Not on file  Stress: Not on file  Social Connections: Not on file  Intimate Partner Violence: Not on file     PHYSICAL EXAM (from 10/03/2021)  Vitals:   09/04/23 1450 09/04/23 1455  BP: (!) 170/98 (!) 140/85  Pulse: 76 84  Weight: 192 lb 8 oz (87.3 kg)   Height: 5\' 6"  (1.676 m)     Body mass index is 31.07 kg/m.  No results found.   General: The patient is well-developed and well-nourished and in no acute distress  HEENT:  Head is [place]/AT.   Sclera are anicteric.    Skin: Extremities are without rash or  edema.   Neurologic Exam  Mental status: The patient is alert and oriented x 3 at the time of the examination. The patient has apparent normal recent and remote memory, with an apparently normal attention span and concentration ability.   Speech is normal.  Cranial nerves: Extraocular movements are full. Normal color vision.  Facial symmetry is present. There is good facial sensation to soft touch bilaterally.Facial strength is normal.  Trapezius and sternocleidomastoid strength is normal. No dysarthria is noted.   Motor:  Muscle bulk is normal.   Tone is normal. Strength is  5 / 5 in all 4 extremities.   Sensory: Sensory testing is intact to pinprick, soft touch and vibration sensation  Coordination: Cerebellar testing reveals good finger-nose-finger and heel-to-shin bilaterally.  Gait and station: Station is normal.  Her gait is near normal.  The tandem gait is wide.   Her Romberg is negative.  Reflexes: Deep tendon reflexes are symmetric and normal bilaterally.         ASSESSMENT AND PLAN  Multiple sclerosis (HCC) - Plan: MR BRAIN WO CONTRAST  Depression with anxiety  Gait disturbance - Plan: MR BRAIN WO CONTRAST  Other fatigue  Attention deficit disorder (ADD) in adult  Essential hypertension  Syncope, unspecified syncope type - Plan: MR BRAIN WO CONTRAST    She needed to stop Ocrevus due to HBV.   She prefers npt to start Copaxone or restart Tysabri and could not tolerate DMF    For now, she wants to remain off of a disease modifying therapy.     She did not do her MRi in December 2024 and I will re-order.  If significant change, would strongly recommend starting a DMT.   She stopped fluoxetine.  She feels mood is doing well.   Spell was likely syncope perhaps related to not eatig that day and BP.  If spell happens again, we will check EEG and cardiac monitor Advised to follow-up with GI/hepatology for  her hep B.   Return in 6 months or sooner if there are new or worsening neurologic symptoms.    This visit is part of a comprehensive longitudinal care medical relationship regarding the patients primary diagnosis of MS and related concerns.    Brach Birdsall A. Epimenio Foot, MD, [facility] 09/04/2023, 3:44 PM Certified in Neurology, Clinical Neurophysiology, Sleep Medicine and Neuroimaging  [facility] [address], [address] [place], [place] [address] [phone]

## 2023-09-11 ENCOUNTER — Ambulatory Visit: Payer: Medicare Other | Admitting: Neurology

## 2023-09-17 ENCOUNTER — Telehealth: Payer: Self-pay | Admitting: Neurology

## 2023-09-17 NOTE — Telephone Encounter (Signed)
 MRI order sent to  [facility] [address] [place], [place] [address] Phone: [phone] Fax: [phone]  No auth required

## 2024-04-14 ENCOUNTER — Ambulatory Visit: Admitting: Neurology

## 2024-11-11 ENCOUNTER — Ambulatory Visit: Admitting: Neurology
